# Patient Record
Sex: Female | Born: 1945 | Race: White | Hispanic: No | Smoking: Current every day smoker
Health system: Southern US, Community
[De-identification: ages and names within clinical notes are randomized; demographics above are authoritative.]

## PROBLEM LIST (undated history)

## (undated) DIAGNOSIS — K219 Gastro-esophageal reflux disease without esophagitis: Secondary | ICD-10-CM

## (undated) DIAGNOSIS — E785 Hyperlipidemia, unspecified: Secondary | ICD-10-CM

## (undated) DIAGNOSIS — I739 Peripheral vascular disease, unspecified: Secondary | ICD-10-CM

## (undated) DIAGNOSIS — E039 Hypothyroidism, unspecified: Secondary | ICD-10-CM

## (undated) DIAGNOSIS — N200 Calculus of kidney: Secondary | ICD-10-CM

## (undated) DIAGNOSIS — R7303 Prediabetes: Secondary | ICD-10-CM

## (undated) DIAGNOSIS — I10 Essential (primary) hypertension: Secondary | ICD-10-CM

## (undated) DIAGNOSIS — I499 Cardiac arrhythmia, unspecified: Secondary | ICD-10-CM

## (undated) DIAGNOSIS — D649 Anemia, unspecified: Secondary | ICD-10-CM

## (undated) DIAGNOSIS — M1990 Unspecified osteoarthritis, unspecified site: Secondary | ICD-10-CM

## (undated) DIAGNOSIS — Z87442 Personal history of urinary calculi: Secondary | ICD-10-CM

## (undated) HISTORY — DX: Anemia, unspecified: D64.9

## (undated) HISTORY — PX: TONSILLECTOMY: SUR1361

## (undated) HISTORY — DX: Hypothyroidism, unspecified: E03.9

## (undated) HISTORY — PX: TUBAL LIGATION: SHX77

## (undated) HISTORY — PX: DILATION AND CURETTAGE OF UTERUS: SHX78

## (undated) HISTORY — PX: LITHOTRIPSY: SUR834

---

## 2011-06-10 LAB — HM DEXA SCAN: HM Dexa Scan: NORMAL

## 2015-06-14 LAB — HM MAMMOGRAPHY

## 2020-12-26 DIAGNOSIS — H524 Presbyopia: Secondary | ICD-10-CM | POA: Diagnosis not present

## 2021-04-02 DIAGNOSIS — Z0100 Encounter for examination of eyes and vision without abnormal findings: Secondary | ICD-10-CM | POA: Diagnosis not present

## 2021-05-17 ENCOUNTER — Other Ambulatory Visit: Payer: Self-pay

## 2021-05-17 ENCOUNTER — Ambulatory Visit (INDEPENDENT_AMBULATORY_CARE_PROVIDER_SITE_OTHER): Payer: Medicare HMO

## 2021-05-17 ENCOUNTER — Encounter: Payer: Self-pay | Admitting: Emergency Medicine

## 2021-05-17 ENCOUNTER — Ambulatory Visit
Admission: EM | Admit: 2021-05-17 | Discharge: 2021-05-17 | Disposition: A | Discharge status: Home / Self Care | Payer: Medicare HMO

## 2021-05-17 DIAGNOSIS — G5762 Lesion of plantar nerve, left lower limb: Secondary | ICD-10-CM | POA: Diagnosis not present

## 2021-05-17 DIAGNOSIS — M7732 Calcaneal spur, left foot: Secondary | ICD-10-CM | POA: Diagnosis not present

## 2021-05-17 DIAGNOSIS — S99922A Unspecified injury of left foot, initial encounter: Secondary | ICD-10-CM | POA: Diagnosis not present

## 2021-05-17 DIAGNOSIS — M79672 Pain in left foot: Secondary | ICD-10-CM

## 2021-05-17 DIAGNOSIS — M7989 Other specified soft tissue disorders: Secondary | ICD-10-CM | POA: Diagnosis not present

## 2021-05-17 HISTORY — DX: Calculus of kidney: N20.0

## 2021-05-17 HISTORY — DX: Hyperlipidemia, unspecified: E78.5

## 2021-05-17 HISTORY — DX: Essential (primary) hypertension: I10

## 2021-05-17 MED ORDER — PREDNISONE 20 MG PO TABS
20.0000 mg | ORAL_TABLET | Freq: Every day | ORAL | 0 refills | Status: AC
Start: 2021-05-17 — End: 2021-05-22

## 2021-05-17 MED ORDER — TRAMADOL HCL 50 MG PO TABS: 50.0000 mg | ORAL_TABLET | Freq: Four times a day (QID) | ORAL | 0 refills | Status: DC | PRN

## 2021-05-17 NOTE — ED Provider Notes (Signed)
[facility]    CSN: [id number] Arrival date & time: 05/17/21  0945      History   Chief Complaint Chief Complaint  Patient presents with   Foot Injury    HPI Kaitlin Sharp is a 75 y.o. female presenting with foot injury.  Medical history cyst in the dorsum of left foot, though this seems to still be intact.  States she was walking down some steps yesterday when she thinks that her foot popped and then she felt pain between her great toe and second toe.  She also notes she was wearing shoes all day yesterday, and she typically is at home and is barefoot.  Endorses pain with burning between the first and second toes, worse with ambulating and pressure.  Denies sensation changes.  Denies injury elsewhere, denies falls.    HPI  Past Medical History:  Diagnosis Date   Dyslipidemia    Hypertension    Kidney calculi     There are no problems to display for this patient.   Past Surgical History:  Procedure Laterality Date   LITHOTRIPSY      OB History   No obstetric history on file.      Home Medications    Prior to Admission medications   Medication Sig Start Date End Date Taking? Authorizing Provider  allopurinol (ZYLOPRIM) 100 MG tablet Take 100 mg by mouth daily. 04/16/21  Yes [provider]  levothyroxine (SYNTHROID) 50 MCG tablet Take 50 mcg by mouth daily. 04/18/21  Yes [provider]  lisinopril (ZESTRIL) 20 MG tablet Take 20 mg by mouth daily. 04/16/21  Yes [provider]  pravastatin (PRAVACHOL) 10 MG tablet Take 10 mg by mouth at bedtime. 05/11/21  Yes [provider]  predniSONE (DELTASONE) 20 MG tablet Take 1 tablet (20 mg total) by mouth daily for 5 days. 05/17/21 05/22/21 Yes Rhys Martini, PA-C  spironolactone (ALDACTONE) 25 MG tablet Take 25 mg by mouth daily. 05/11/21  Yes [provider]  traMADol (ULTRAM) 50 MG tablet Take 1 tablet (50 mg total) by mouth every 6 (six) hours as needed.  05/17/21  Yes Rhys Martini, PA-C    Family History History reviewed. No pertinent family history.  Social History Social History   Tobacco Use   Smoking status: Every Day    Types: Cigarettes   Smokeless tobacco: Never     Allergies   Patient has no known allergies.   Review of Systems Review of Systems  Musculoskeletal:        L foot pain  All other systems reviewed and are negative.   Physical Exam Triage Vital Signs ED Triage Vitals  Enc Vitals Group     BP 05/17/21 1035 (!) 161/87     Pulse Rate 05/17/21 1035 68     Resp 05/17/21 1035 14     Temp 05/17/21 1035 (!) 97.5 F (36.4 C)     Temp Source 05/17/21 1035 Oral     SpO2 05/17/21 1035 97 %     Weight --      Height --      Head Circumference --      Peak Flow --      Pain Score 05/17/21 1038 3     Pain Loc --      Pain Edu? --      Excl. in GC? --    No data found.  Updated Vital Signs BP (!) 161/87 (BP Location: Left Arm)  Pulse 68   Temp (!) 97.5 F (36.4 C) (Oral)   Resp 14   SpO2 97%   Visual Acuity Right Eye Distance:   Left Eye Distance:   Bilateral Distance:    Right Eye Near:   Left Eye Near:    Bilateral Near:     Physical Exam Vitals reviewed.  Constitutional:      General: She is not in acute distress.    Appearance: Normal appearance. She is not ill-appearing or diaphoretic.  HENT:     Head: Normocephalic and atraumatic.  Cardiovascular:     Rate and Rhythm: Normal rate and regular rhythm.     Heart sounds: Normal heart sounds.  Pulmonary:     Effort: Pulmonary effort is normal.     Breath sounds: Normal breath sounds.  Musculoskeletal:     Comments: L foot- dorsum with firm well circumscribed cyst, nontender, mobile. Tenderness between great toe and 2nd toe, positive squeeze test. Sensation intact. Rom toes and ankle intact and without pain. No medial or lateral malleolar tenderness. DP 2+, cap refill <2 seconds, sensation intact.  Skin:    General: Skin is warm.   Neurological:     General: No focal deficit present.     Mental Status: She is alert and oriented to person, place, and time.  Psychiatric:        Mood and Affect: Mood normal.        Behavior: Behavior normal.        Thought Content: Thought content normal.        Judgment: Judgment normal.     UC Treatments / Results  Labs (all labs ordered are listed, but only abnormal results are displayed) Labs Reviewed - No data to display  EKG   Radiology DG Foot Complete Left  Result Date: 05/17/2021 CLINICAL DATA:  Stepped and felt a pop, pain and swelling present EXAM: LEFT FOOT - COMPLETE 3+ VIEW COMPARISON:  07/11/2020 FINDINGS: There is no evidence of fracture or dislocation. Joint spaces appear preserved. Small plantar calcaneal spur. Focal soft tissue swelling at the dorsal aspect of the foot overlying the level of the tarsometatarsal joints. IMPRESSION: No acute fracture or dislocation. Focal soft tissue swelling at the dorsal aspect of the foot overlying the tarsometatarsal joints. Electronically Signed   By: Duanne Guess D.O.   On: 05/17/2021 11:19    Procedures Procedures (including critical care time)  Medications Ordered in UC Medications - No data to display  Initial Impression / Assessment and Plan / UC Course  I have reviewed the triage vital signs and the nursing notes.  Pertinent labs & imaging results that were available during my care of the patient were reviewed by me and considered in my medical decision making (see chart for details).     This patient is a very pleasant 75 y.o. year old female presenting with possible Morton's Neuroma. Neurovascularly intact.  Xray L foot- No acute fracture or dislocation. Focal soft tissue swelling at the dorsal aspect of the foot overlying the tarsometatarsal joints.  Prednisone, short course of Tramadol, wear wide shoes. F/u with podiatry.  ED return precautions discussed. Patient verbalizes understanding and  agreement.     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 Morton's neuroma involves a thickening  of the tissue around one of the nerves leading to your toes. This can cause a sharp, burning pain in the ball of your foot. You may have stinging, burning or numbness in the affected toes -Prednisone one pill taken with breakfast or lunch x5 days. I recommend taking this in the morning as it could give you energy.  Avoid NSAIDs like ibuprofen and alleve while taking this medication as they can increase your risk of stomach upset and even GI bleeding when in combination with a steroid. You can continue tylenol (acetaminophen) up to 1000mg  3x daily. -I also sent a short course of Tramadol, which is a prescription strength pain medication. You can take this up to every 6 hours for pain. This can cause drowsiness.  -Wear loose fitting shoes  -Follow-up with PCP or podiatrist if symptoms persist in 4-5 days. Information below.      ED Prescriptions     Medication Sig Dispense Auth. Provider   predniSONE (DELTASONE) 20 MG tablet Take 1 tablet (20 mg total) by mouth daily for 5 days. 5 tablet , PA-C   traMADol (ULTRAM) 50 MG tablet Take 1 tablet (50 mg total) by mouth every 6 (six) hours as needed. 8 tablet Rhys Martini, PA-C      I have reviewed the PDMP during this encounter.   Rhys Martini, PA-C 05/17/21 1212

## 2021-05-17 NOTE — ED Triage Notes (Signed)
Felt a pop in her foot yesterday, has hurt since. Happened when she stepped down. Says it's popped before but she never thought much of it. Has a cyst on that foot, appears to still be intact. Foot swollen, no obvious injury or bruising.

## 2021-05-17 NOTE — Discharge Instructions (Addendum)
-  You don't have any fracture in your foot -I think you have a Morton's Neuroma. Morton's neuroma involves a thickening of the tissue around one of the nerves leading to your toes. This can cause a sharp, burning pain in the ball of your foot. You may have stinging, burning or numbness in the affected toes -Prednisone one pill taken with breakfast or lunch x5 days. I recommend taking this in the morning as it could give you energy.  Avoid NSAIDs like ibuprofen and alleve while taking this medication as they can increase your risk of stomach upset and even GI bleeding when in combination with a steroid. You can continue tylenol (acetaminophen) up to 1000mg  3x daily. -I also sent a short course of Tramadol, which is a prescription strength pain medication. You can take this up to every 6 hours for pain. This can cause drowsiness.  -Wear loose fitting shoes  -Follow-up with PCP or podiatrist if symptoms persist in 4-5 days. Information below.

## 2021-05-30 DIAGNOSIS — J449 Chronic obstructive pulmonary disease, unspecified: Secondary | ICD-10-CM | POA: Diagnosis not present

## 2021-05-30 DIAGNOSIS — R0600 Dyspnea, unspecified: Secondary | ICD-10-CM | POA: Diagnosis not present

## 2021-05-30 DIAGNOSIS — R799 Abnormal finding of blood chemistry, unspecified: Secondary | ICD-10-CM | POA: Diagnosis not present

## 2021-05-30 DIAGNOSIS — M150 Primary generalized (osteo)arthritis: Secondary | ICD-10-CM | POA: Diagnosis not present

## 2021-05-30 DIAGNOSIS — R051 Acute cough: Secondary | ICD-10-CM | POA: Diagnosis not present

## 2021-05-30 DIAGNOSIS — E119 Type 2 diabetes mellitus without complications: Secondary | ICD-10-CM | POA: Diagnosis not present

## 2021-05-30 DIAGNOSIS — Z0001 Encounter for general adult medical examination with abnormal findings: Secondary | ICD-10-CM | POA: Diagnosis not present

## 2021-05-30 DIAGNOSIS — Z79899 Other long term (current) drug therapy: Secondary | ICD-10-CM | POA: Diagnosis not present

## 2021-05-30 DIAGNOSIS — E039 Hypothyroidism, unspecified: Secondary | ICD-10-CM | POA: Diagnosis not present

## 2021-05-30 DIAGNOSIS — R351 Nocturia: Secondary | ICD-10-CM | POA: Diagnosis not present

## 2021-05-30 DIAGNOSIS — R079 Chest pain, unspecified: Secondary | ICD-10-CM | POA: Diagnosis not present

## 2021-05-30 DIAGNOSIS — I10 Essential (primary) hypertension: Secondary | ICD-10-CM | POA: Diagnosis not present

## 2021-05-30 DIAGNOSIS — E7800 Pure hypercholesterolemia, unspecified: Secondary | ICD-10-CM | POA: Diagnosis not present

## 2021-05-30 DIAGNOSIS — M1009 Idiopathic gout, multiple sites: Secondary | ICD-10-CM | POA: Diagnosis not present

## 2021-05-31 DIAGNOSIS — Z1211 Encounter for screening for malignant neoplasm of colon: Secondary | ICD-10-CM | POA: Diagnosis not present

## 2021-06-26 DIAGNOSIS — K921 Melena: Secondary | ICD-10-CM | POA: Diagnosis not present

## 2021-07-02 DIAGNOSIS — K648 Other hemorrhoids: Secondary | ICD-10-CM | POA: Diagnosis not present

## 2021-07-02 DIAGNOSIS — K5730 Diverticulosis of large intestine without perforation or abscess without bleeding: Secondary | ICD-10-CM | POA: Diagnosis not present

## 2021-07-02 DIAGNOSIS — K921 Melena: Secondary | ICD-10-CM | POA: Diagnosis not present

## 2021-07-02 LAB — HM COLONOSCOPY

## 2021-08-30 DIAGNOSIS — E039 Hypothyroidism, unspecified: Secondary | ICD-10-CM | POA: Diagnosis not present

## 2021-08-30 DIAGNOSIS — R1013 Epigastric pain: Secondary | ICD-10-CM | POA: Diagnosis not present

## 2021-08-30 DIAGNOSIS — E119 Type 2 diabetes mellitus without complications: Secondary | ICD-10-CM | POA: Diagnosis not present

## 2021-08-30 DIAGNOSIS — Z79899 Other long term (current) drug therapy: Secondary | ICD-10-CM | POA: Diagnosis not present

## 2021-08-30 DIAGNOSIS — R0600 Dyspnea, unspecified: Secondary | ICD-10-CM | POA: Diagnosis not present

## 2021-08-30 DIAGNOSIS — E7800 Pure hypercholesterolemia, unspecified: Secondary | ICD-10-CM | POA: Diagnosis not present

## 2021-08-30 DIAGNOSIS — R42 Dizziness and giddiness: Secondary | ICD-10-CM | POA: Diagnosis not present

## 2021-08-30 DIAGNOSIS — I10 Essential (primary) hypertension: Secondary | ICD-10-CM | POA: Diagnosis not present

## 2021-08-30 LAB — COMPREHENSIVE METABOLIC PANEL: eGFR: 42

## 2021-08-30 LAB — BASIC METABOLIC PANEL
Creatinine: 1.3 — AB (ref 0.5–1.1)
EGFR (Non-African Amer.): 42
Glucose: 110

## 2021-08-30 LAB — LIPID PANEL: Cholesterol: 49 (ref 0–200)

## 2022-04-22 ENCOUNTER — Ambulatory Visit (INDEPENDENT_AMBULATORY_CARE_PROVIDER_SITE_OTHER): Payer: Medicare HMO | Admitting: Nurse Practitioner

## 2022-04-22 ENCOUNTER — Encounter: Payer: Self-pay | Admitting: Nurse Practitioner

## 2022-04-22 VITALS — BP 121/77 | HR 61 | Temp 97.8°F | Ht 61.02 in | Wt 150.0 lb

## 2022-04-22 DIAGNOSIS — Z7689 Persons encountering health services in other specified circumstances: Secondary | ICD-10-CM | POA: Diagnosis not present

## 2022-04-22 DIAGNOSIS — M064 Inflammatory polyarthropathy: Secondary | ICD-10-CM | POA: Diagnosis not present

## 2022-04-22 DIAGNOSIS — M1A09X0 Idiopathic chronic gout, multiple sites, without tophus (tophi): Secondary | ICD-10-CM | POA: Diagnosis not present

## 2022-04-22 DIAGNOSIS — Z6828 Body mass index (BMI) 28.0-28.9, adult: Secondary | ICD-10-CM

## 2022-04-22 DIAGNOSIS — E039 Hypothyroidism, unspecified: Secondary | ICD-10-CM | POA: Diagnosis not present

## 2022-04-22 MED ORDER — LEVOTHYROXINE SODIUM 50 MCG PO TABS: 50.0000 ug | ORAL_TABLET | Freq: Every day | ORAL | 1 refills | Status: DC

## 2022-04-22 MED ORDER — ALLOPURINOL 100 MG PO TABS: 100.0000 mg | ORAL_TABLET | Freq: Every day | ORAL | 3 refills | Status: DC

## 2022-04-22 NOTE — Progress Notes (Signed)
New Patient Office Visit  Subjective    Patient ID: Kaitlin Sharp, female    DOB: May 13, 1946  Age: 76 y.o. MRN: [id number]  CC:  Chief Complaint  Patient presents with   New Patient (Initial Visit)    HPI Kaitlin Sharp presents to establish care -The patient is coming from a different provider who is getting ready to retire. Will need to get records to review.  -will be due to have routine, fasting labs done along with wellness visit in august.  -she does need new prescriptions for allopurinol and levothyroxine.  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 Takes tylenol   Outpatient Encounter Medications as of 04/22/2022  Medication Sig   lisinopril (ZESTRIL) 20 MG tablet Take 20 mg by mouth daily.   pravastatin (PRAVACHOL) 10 MG tablet Take 10 mg by mouth at bedtime.   spironolactone (ALDACTONE) 25 MG tablet Take 25 mg by mouth daily.   [DISCONTINUED] allopurinol (ZYLOPRIM) 100 MG tablet Take 100 mg by mouth daily.   [DISCONTINUED] levothyroxine (SYNTHROID) 50 MCG tablet Take 50 mcg by mouth daily.   allopurinol (ZYLOPRIM) 100 MG tablet Take 1 tablet (100 mg total) by mouth daily.   levothyroxine (SYNTHROID) 50 MCG tablet Take 1 tablet (50 mcg total) by mouth daily.   traMADol (ULTRAM) 50 MG tablet Take 1 tablet (50 mg total) by mouth every 6 (six) hours as needed. (Patient not taking: Reported on 04/22/2022)   No facility-administered encounter medications on file as of 04/22/2022.    Past Medical History:  Diagnosis Date   Dyslipidemia    Hypertension    Kidney calculi     Past Surgical History:  Procedure Laterality Date   LITHOTRIPSY      Family History  Problem Relation Age of Onset   Brain cancer Father    Heart attack Brother     Social History   Socioeconomic History   Marital status: Single    Spouse name: Not on file   Number of children: Not on file   Years of education: Not on file   Highest education level: Not on file  Occupational  History   Not on file  Tobacco Use   Smoking status: Every Day    Types: Cigarettes   Smokeless tobacco: Never  Vaping Use   Vaping Use: Not on file  Substance and Sexual Activity   Alcohol use: Yes   Drug use: Never   Sexual activity: Not Currently  Other Topics Concern   Not on file  Social History Narrative   Not on file   Social Determinants of Health   Financial Resource Strain: Not on file  Food Insecurity: Not on file  Transportation Needs: Not on file  Physical Activity: Not on file  Stress: Not on file  Social Connections: Not on file  Intimate Partner Violence: Not on file    Review of Systems  Constitutional:  Positive for malaise/fatigue. Negative for chills and fever.  HENT:  Negative for congestion, sinus pain and sore throat.   Eyes: Negative.   Respiratory:  Negative for cough, shortness of breath and wheezing.   Cardiovascular:  Negative for chest pain, palpitations and leg swelling.  Gastrointestinal:  Negative for constipation, diarrhea, nausea and vomiting.  Genitourinary: Negative.   Musculoskeletal:  Positive for joint pain and myalgias.  Skin: Negative.   Neurological:  Negative for dizziness and headaches.  Endo/Heme/Allergies:  Does not bruise/bleed easily.       History of hypothyroid   Psychiatric/Behavioral:  Negative for depression. The patient is not nervous/anxious.         Objective    Today's Vitals   04/22/22 1522  BP: 121/77  Pulse: 61  Temp: 97.8 F (36.6 C)  SpO2: 98%  Weight: 150 lb (68 kg)  Height: 5' 1.02" (1.55 m)   Body mass index is 28.32 kg/m.   Physical Exam     Assessment & Plan:  1. Acquired hypothyroidism Continue levothyroxine 50 mcg daily. Check thyroid panel at next visit and adjust thyroid medication as indicated.  - levothyroxine (SYNTHROID) 50 MCG tablet; Take 1 tablet (50 mcg total) by mouth daily.  Dispense: 90 tablet; Refill: 1  2. Idiopathic chronic gout of multiple sites without  tophus Continue allopurinol 100 mg daily to prevent gout flares. Check uric acid level at next visit.  - allopurinol (ZYLOPRIM) 100 MG tablet; Take 1 tablet (100 mg total) by mouth daily.  Dispense: 90 tablet; Refill: 3  3. Inflammatory polyarthropathy (HCC) Check connective tissue panel at next visit and refer to rheumatology as indicated.   4. BMI 28.0-28.9,adult Discussed lowering calorie intake to 1500 calories per day and incorporating exercise into daily routine to help lose weight.   5. Encounter to establish care Appointment today to establish new primary care provider. Get records from previous provider to review and update patient chart.     Problem List Items Addressed This Visit       Endocrine   Acquired hypothyroidism - Primary   Relevant Medications   levothyroxine (SYNTHROID) 50 MCG tablet     Musculoskeletal and Integument   Inflammatory polyarthropathy (HCC)   Relevant Medications   allopurinol (ZYLOPRIM) 100 MG tablet     Other   Idiopathic chronic gout of multiple sites without tophus   Relevant Medications   allopurinol (ZYLOPRIM) 100 MG tablet   BMI 28.0-28.9,adult   Other Visit Diagnoses     Encounter to establish care           Return in about 2 months (around 06/22/2022) for medicare wellness, FBW at time of visit - Plesse get records from Dr. Levora Angel. pt. has letter .   Carlean Jews, NP

## 2022-04-27 DIAGNOSIS — M1A09X0 Idiopathic chronic gout, multiple sites, without tophus (tophi): Secondary | ICD-10-CM | POA: Insufficient documentation

## 2022-04-27 DIAGNOSIS — Z6828 Body mass index (BMI) 28.0-28.9, adult: Secondary | ICD-10-CM | POA: Insufficient documentation

## 2022-04-27 DIAGNOSIS — M064 Inflammatory polyarthropathy: Secondary | ICD-10-CM | POA: Insufficient documentation

## 2022-04-27 DIAGNOSIS — E039 Hypothyroidism, unspecified: Secondary | ICD-10-CM | POA: Insufficient documentation

## 2022-06-20 ENCOUNTER — Other Ambulatory Visit: Payer: Self-pay | Admitting: Nurse Practitioner

## 2022-06-23 ENCOUNTER — Ambulatory Visit (INDEPENDENT_AMBULATORY_CARE_PROVIDER_SITE_OTHER): Payer: Medicare HMO | Admitting: Nurse Practitioner

## 2022-06-23 ENCOUNTER — Encounter: Payer: Self-pay | Admitting: Nurse Practitioner

## 2022-06-23 VITALS — BP 116/69 | HR 60 | Ht 61.02 in | Wt 154.0 lb

## 2022-06-23 DIAGNOSIS — M1A00X0 Idiopathic chronic gout, unspecified site, without tophus (tophi): Secondary | ICD-10-CM | POA: Diagnosis not present

## 2022-06-23 DIAGNOSIS — E039 Hypothyroidism, unspecified: Secondary | ICD-10-CM

## 2022-06-23 DIAGNOSIS — Z0000 Encounter for general adult medical examination without abnormal findings: Secondary | ICD-10-CM | POA: Diagnosis not present

## 2022-06-23 DIAGNOSIS — E782 Mixed hyperlipidemia: Secondary | ICD-10-CM | POA: Diagnosis not present

## 2022-06-23 DIAGNOSIS — I10 Essential (primary) hypertension: Secondary | ICD-10-CM | POA: Diagnosis not present

## 2022-06-23 MED ORDER — LISINOPRIL 20 MG PO TABS: 20.0000 mg | ORAL_TABLET | Freq: Every day | ORAL | 3 refills | Status: DC

## 2022-06-23 MED ORDER — SPIRONOLACTONE 25 MG PO TABS: 25.0000 mg | ORAL_TABLET | Freq: Every day | ORAL | 3 refills | Status: DC

## 2022-06-23 MED ORDER — PRAVASTATIN SODIUM 10 MG PO TABS: 10.0000 mg | ORAL_TABLET | Freq: Every day | ORAL | 3 refills | Status: DC

## 2022-06-23 NOTE — Progress Notes (Signed)
Subjective:   Kaitlin Sharp is a 76 y.o. female who presents for Medicare Annual (Subsequent) preventive examination. -would like all of her routine medications changed over to this office.  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 Gradually worsening. Would like ot do some exercises rather than take new medication for this if possible.  -she is a smoker. Smokes a little more than 1/2 pack of cigarettes per day.  -denies chest pain, chest pressure, or shortness of breath which is out of the ordinary for her.   Review of Systems    Review of Systems  Constitutional:  Negative for chills, fever and malaise/fatigue.  HENT:  Negative for congestion, sinus pain and sore throat.   Eyes: Negative.   Respiratory:  Negative for cough, shortness of breath and wheezing.   Cardiovascular:  Negative for chest pain, palpitations and leg swelling.  Gastrointestinal:  Negative for constipation, diarrhea, nausea and vomiting.  Genitourinary: Negative.   Musculoskeletal:  Negative for myalgias.  Skin: Negative.   Neurological:  Negative for dizziness and headaches.  Endo/Heme/Allergies:  Does not bruise/bleed easily.  Psychiatric/Behavioral:  Negative for depression. The patient is not nervous/anxious.           Objective:    Today's Vitals   06/23/22 0943  BP: 116/69  Pulse: 60  SpO2: 97%  Weight: 154 lb (69.9 kg)  Height: 5' 1.02" (1.55 m)   Body mass index is 29.08 kg/m.   Current Medications (verified) Outpatient Encounter Medications as of 06/23/2022  Medication Sig   allopurinol (ZYLOPRIM) 100 MG tablet Take 1 tablet (100 mg total) by mouth daily.   levothyroxine (SYNTHROID) 50 MCG tablet Take 1 tablet (50 mcg total) by mouth daily.   [DISCONTINUED] lisinopril (ZESTRIL) 20 MG tablet Take 20 mg by mouth daily.   [DISCONTINUED] pravastatin (PRAVACHOL) 10 MG tablet Take 10 mg by mouth at bedtime.   [DISCONTINUED] spironolactone (ALDACTONE) 25 MG tablet  Take 25 mg by mouth daily.   lisinopril (ZESTRIL) 20 MG tablet Take 1 tablet (20 mg total) by mouth daily.   pravastatin (PRAVACHOL) 10 MG tablet Take 1 tablet (10 mg total) by mouth at bedtime.   spironolactone (ALDACTONE) 25 MG tablet Take 1 tablet (25 mg total) by mouth daily.   traMADol (ULTRAM) 50 MG tablet Take 1 tablet (50 mg total) by mouth every 6 (six) hours as needed. (Patient not taking: Reported on 04/22/2022)   No facility-administered encounter medications on file as of 06/23/2022.    Allergies (verified) Patient has no known allergies.   History: Past Medical History:  Diagnosis Date   Dyslipidemia    Hypertension    Kidney calculi    Past Surgical History:  Procedure Laterality Date   LITHOTRIPSY     Family History  Problem Relation Age of Onset   Brain cancer Father    Heart attack Brother    Social History   Socioeconomic History   Marital status: Single    Spouse name: Not on file   Number of children: Not on file   Years of education: Not on file   Highest education level: Not on file  Occupational History   Not on file  Tobacco Use   Smoking status: Every Day    Types: Cigarettes   Smokeless tobacco: Never  Vaping Use   Vaping Use: Not on file  Substance and Sexual Activity   Alcohol use: Yes   Drug use: Never   Sexual activity: Not Currently  Other Topics Concern   Not on file  Social History Narrative   Not on file   Social Determinants of Health   Financial Resource Strain: Not on file  Food Insecurity: Not on file  Transportation Needs: Not on file  Physical Activity: Not on file  Stress: Not on file  Social Connections: Not on file   Physical Exam Vitals and nursing note reviewed.  Constitutional:      Appearance: Normal appearance. She is well-developed.  HENT:     Head: Normocephalic and atraumatic.     Right Ear: Tympanic membrane, ear canal and external ear normal.     Left Ear: Tympanic membrane, ear canal and external  ear normal.     Nose: Nose normal.     Mouth/Throat:     Mouth: Mucous membranes are moist.     Pharynx: Oropharynx is clear.  Eyes:     Extraocular Movements: Extraocular movements intact.     Conjunctiva/sclera: Conjunctivae normal.     Pupils: Pupils are equal, round, and reactive to light.  Cardiovascular:     Rate and Rhythm: Normal rate and regular rhythm.     Pulses: Normal pulses.     Heart sounds: Normal heart sounds.  Pulmonary:     Effort: Pulmonary effort is normal.     Breath sounds: Wheezing present.     Comments: Soft, low-pitched wheezes auscultated during today's visit  Abdominal:     General: Bowel sounds are normal. There is no distension.     Palpations: Abdomen is soft. There is no mass.     Tenderness: There is no abdominal tenderness. There is no right CVA tenderness, left CVA tenderness, guarding or rebound.     Hernia: No hernia is present.  Musculoskeletal:        General: Normal range of motion.     Cervical back: Normal range of motion and neck supple.  Lymphadenopathy:     Cervical: No cervical adenopathy.  Skin:    General: Skin is warm and dry.     Capillary Refill: Capillary refill takes less than 2 seconds.  Neurological:     General: No focal deficit present.     Mental Status: She is alert and oriented to person, place, and time.  Psychiatric:        Mood and Affect: Mood normal.        Behavior: Behavior normal.        Thought Content: Thought content normal.        Judgment: Judgment normal.      Tobacco Counseling Patient is a current and every day smoker, smoking about 1/2 pack of cigarettes per day. She is not ready to quit at this time.   Diabetic?no   Activities of Daily Living    04/22/2022    3:29 PM  In your present state of health, do you have any difficulty performing the following activities:  Hearing? 0  Vision? 0  Difficulty concentrating or making decisions? 0  Walking or climbing stairs? 1  Dressing or bathing?  0  Doing errands, shopping? 0    Patient Care Team: Carlean Jews, NP as PCP - General (Family Medicine)  Indicate any recent Medical Services you may have received from other than [facility] providers in the past year (date may be approximate).     Assessment:  1. Encounter for Medicare annual wellness exam Annual medicare wellness visit   2. Essential hypertension Stable. Continue lisinopril and aldactone as prescribed. New prescriptions  sent to her pharmacy today  - lisinopril (ZESTRIL) 20 MG tablet; Take 1 tablet (20 mg total) by mouth daily.  Dispense: 90 tablet; Refill: 3 - spironolactone (ALDACTONE) 25 MG tablet; Take 1 tablet (25 mg total) by mouth daily.  Dispense: 90 tablet; Refill: 3  3. Mixed hyperlipidemia Check fasting lipids today. Adjust dose pravastatin as indicated  - Lipid panel - Comprehensive metabolic panel - CBC - pravastatin (PRAVACHOL) 10 MG tablet; Take 1 tablet (10 mg total) by mouth at bedtime.  Dispense: 90 tablet; Refill: 3  4. Acquired hypothyroidism Check thyroid panel and adjust levothyroxine as indicated  - TSH + free T4 - Hemoglobin A1c  5. Chronic idiopathic gout Continue allopurinol daily to prevent gout flare  6. Healthcare maintenance Routine, fasting labs drawn during today's visit.  - Hemoglobin A1c - Lipid panel - Comprehensive metabolic panel - CBC   Hearing/Vision screen No results found.   Depression Screen    06/23/2022    9:45 AM 04/22/2022    3:29 PM  PHQ 2/9 Scores  PHQ - 2 Score 0 0  PHQ- 9 Score 4 2    Fall Risk    06/23/2022    9:49 AM  Fall Risk   Falls in the past year? 0  Number falls in past yr: 0  Injury with Fall? 0  Risk for fall due to : No Fall Risks  Follow up Falls evaluation completed    FALL RISK PREVENTION PERTAINING TO THE HOME:  Any stairs in or around the home? Yes  If so, are there any without handrails? Yes  Home free of loose throw rugs in walkways, pet beds, electrical cords, etc?  Yes  Adequate lighting in your home to reduce risk of falls? Yes   ASSISTIVE DEVICES UTILIZED TO PREVENT FALLS:  Life alert? No  Use of a cane, walker or w/c? Yes  Grab bars in the bathroom? Yes  Shower chair or bench in shower? Yes  Elevated toilet seat or a handicapped toilet? No   TIMED UP AND GO:  Was the test performed? Yes .  Length of time to ambulate 10 feet: 10 sec.   Gait steady and fast without use of assistive device  Cognitive Function:        06/23/2022    9:49 AM  6CIT Screen  What Year? 0 points  What month? 0 points  Count back from 20 0 points  Months in reverse 0 points  Repeat phrase 0 points    Immunizations Immunization History  Administered Date(s) Administered   Influenza, Quadrivalent, Recombinant, Inj, Pf 07/20/2021   PFIZER(Purple Top)SARS-COV-2 Vaccination 01/08/2020, 01/30/2020   PNEUMOCOCCAL CONJUGATE-20 07/20/2021   Zoster Recombinat (Shingrix) 05/22/2018, 08/16/2018    TDAP status: Due, Education has been provided regarding the importance of this vaccine. Advised may receive this vaccine at local pharmacy or Health Dept. Aware to provide a copy of the vaccination record if obtained from local pharmacy or Health Dept. Verbalized acceptance and understanding.  Flu Vaccine status: Due, Education has been provided regarding the importance of this vaccine. Advised may receive this vaccine at local pharmacy or Health Dept. Aware to provide a copy of the vaccination record if obtained from local pharmacy or Health Dept. Verbalized acceptance and understanding.  Pneumococcal vaccine status: Due, Education has been provided regarding the importance of this vaccine. Advised may receive this vaccine at local pharmacy or Health Dept. Aware to provide a copy of the vaccination record if obtained from local pharmacy  or Health Dept. Verbalized acceptance and understanding.  Covid-19 vaccine status: Information provided on how to obtain vaccines.    Qualifies for Shingles Vaccine? Yes   Zostavax completed No   Shingrix Completed?: No.    Education has been provided regarding the importance of this vaccine. Patient has been advised to call insurance company to determine out of pocket expense if they have not yet received this vaccine. Advised may also receive vaccine at local pharmacy or Health Dept. Verbalized acceptance and understanding.  Screening Tests Health Maintenance  Topic Date Due   Hepatitis C Screening  Never done   COLONOSCOPY (Pts 45-29yrs Insurance coverage will need to be confirmed)  Never done   DEXA SCAN  Never done   INFLUENZA VACCINE  05/27/2022   COVID-19 Vaccine (3 - Pfizer series) 07/09/2022 (Originally 03/26/2020)   TETANUS/TDAP  06/24/2023 (Originally 05/20/1965)   Pneumonia Vaccine 61+ Years old  Completed   Zoster Vaccines- Shingrix  Completed   HPV VACCINES  Aged Out    Health Maintenance  Health Maintenance Due  Topic Date Due   Hepatitis C Screening  Never done   COLONOSCOPY (Pts 45-18yrs Insurance coverage will need to be confirmed)  Never done   DEXA SCAN  Never done   INFLUENZA VACCINE  05/27/2022    Colorectal cancer screening: Type of screening: Colonoscopy. Completed unknown. Repeat every unknown years  Mammogram -  patient does not remember   Lung Cancer Screening: (Low Dose CT Chest recommended if Age 70-80 years, 30 pack-year currently smoking OR have quit w/in 15years.) does not qualify.   Lung Cancer Screening Referral: n/a  Additional Screening:  Hepatitis C Screening: does not qualify; Completed   Vision Screening: Recommended annual ophthalmology exams for early detection of glaucoma and other disorders of the eye. Is the patient up to date with their annual eye exam?  Yes  Who is the provider or what is the name of the office in which the patient attends annual eye exams? [facility] If pt is not established with a provider, would they like to be referred to a provider  to establish care? No .   Dental Screening: Recommended annual dental exams for proper oral hygiene  Community Resource Referral / Chronic Care Management: CRR required this visit?  No   CCM required this visit?  No      Plan:     I have personally reviewed and noted the following in the patient's chart:   Medical and social history Use of alcohol, tobacco or illicit drugs  Current medications and supplements including opioid prescriptions. Patient is not currently taking opioid prescriptions. Functional ability and status Nutritional status Physical activity Advanced directives List of other physicians Hospitalizations, surgeries, and ER visits in previous 12 months Vitals Screenings to include cognitive, depression, and falls Referrals and appointments  In addition, I have reviewed and discussed with patient certain preventive protocols, quality metrics, and best practice recommendations. A written personalized care plan for preventive services as well as general preventive health recommendations were provided to patient.     Vincent Gros, FNP-c 06/23/2022

## 2022-06-24 LAB — COMPREHENSIVE METABOLIC PANEL
ALT: 16 IU/L (ref 0–32)
AST: 19 IU/L (ref 0–40)
Albumin/Globulin Ratio: 2 (ref 1.2–2.2)
Albumin: 4.3 g/dL (ref 3.8–4.8)
Alkaline Phosphatase: 90 IU/L (ref 44–121)
BUN/Creatinine Ratio: 17 (ref 12–28)
BUN: 22 mg/dL (ref 8–27)
Bilirubin Total: 0.4 mg/dL (ref 0.0–1.2)
CO2: 20 mmol/L (ref 20–29)
Calcium: 11 mg/dL — ABNORMAL HIGH (ref 8.7–10.3)
Chloride: 104 mmol/L (ref 96–106)
Creatinine, Ser: 1.31 mg/dL — ABNORMAL HIGH (ref 0.57–1.00)
Globulin, Total: 2.1 g/dL (ref 1.5–4.5)
Glucose: 106 mg/dL — ABNORMAL HIGH (ref 70–99)
Potassium: 5.2 mmol/L (ref 3.5–5.2)
Sodium: 139 mmol/L (ref 134–144)
Total Protein: 6.4 g/dL (ref 6.0–8.5)
eGFR: 42 mL/min/{1.73_m2} — ABNORMAL LOW (ref >59)

## 2022-06-24 LAB — CBC
Hematocrit: 35.4 % (ref 34.0–46.6)
Hemoglobin: 12.3 g/dL (ref 11.1–15.9)
MCH: 33.9 pg — ABNORMAL HIGH (ref 26.6–33.0)
MCHC: 34.7 g/dL (ref 31.5–35.7)
MCV: 98 fL — ABNORMAL HIGH (ref 79–97)
Platelets: 160 10*3/uL (ref 150–450)
RBC: 3.63 x10E6/uL — ABNORMAL LOW (ref 3.77–5.28)
RDW: 12 % (ref 11.7–15.4)
WBC: 8.3 10*3/uL (ref 3.4–10.8)

## 2022-06-24 LAB — TSH+FREE T4
Free T4: 1.23 ng/dL (ref 0.82–1.77)
TSH: 5.64 u[IU]/mL — ABNORMAL HIGH (ref 0.450–4.500)

## 2022-06-24 LAB — LIPID PANEL
Chol/HDL Ratio: 3.9 ratio (ref 0.0–4.4)
Cholesterol, Total: 172 mg/dL (ref 100–199)
HDL: 44 mg/dL (ref >39)
LDL Chol Calc (NIH): 97 mg/dL (ref 0–99)
Triglycerides: 179 mg/dL — ABNORMAL HIGH (ref 0–149)
VLDL Cholesterol Cal: 31 mg/dL (ref 5–40)

## 2022-06-24 LAB — HEMOGLOBIN A1C
Est. average glucose Bld gHb Est-mCnc: 128 mg/dL
Hgb A1c MFr Bld: 6.1 % — ABNORMAL HIGH (ref 4.8–5.6)

## 2022-06-28 IMAGING — DX DG FOOT COMPLETE 3+V*L*
3 series · 3 of 3 positions shown · non-contrast
Comparison: 07/11/2020

CLINICAL DATA: Stepped and felt a pop, pain and swelling present

EXAM:
LEFT FOOT - COMPLETE 3+ VIEW

[foot supine dp]
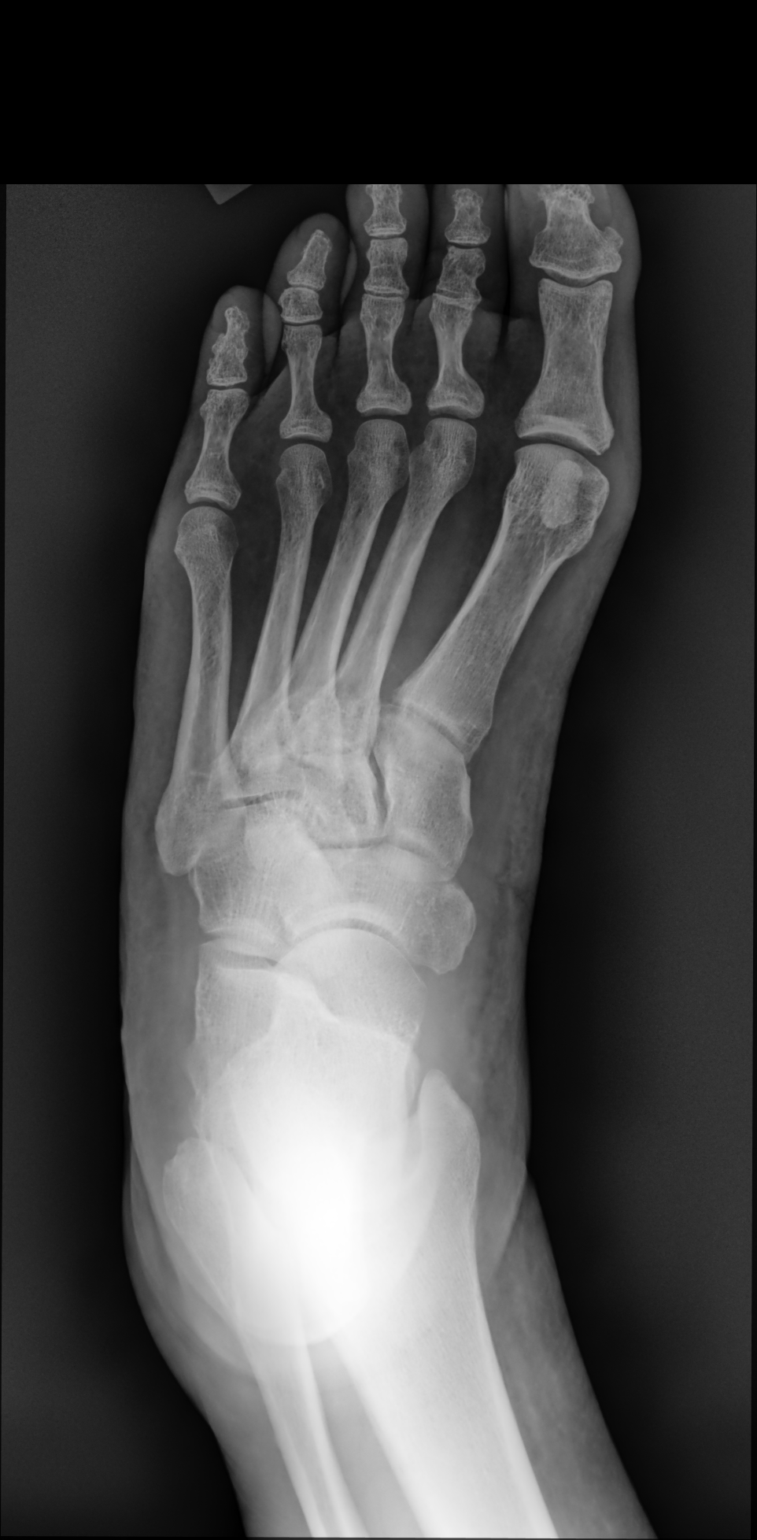

[foot medial oblique]
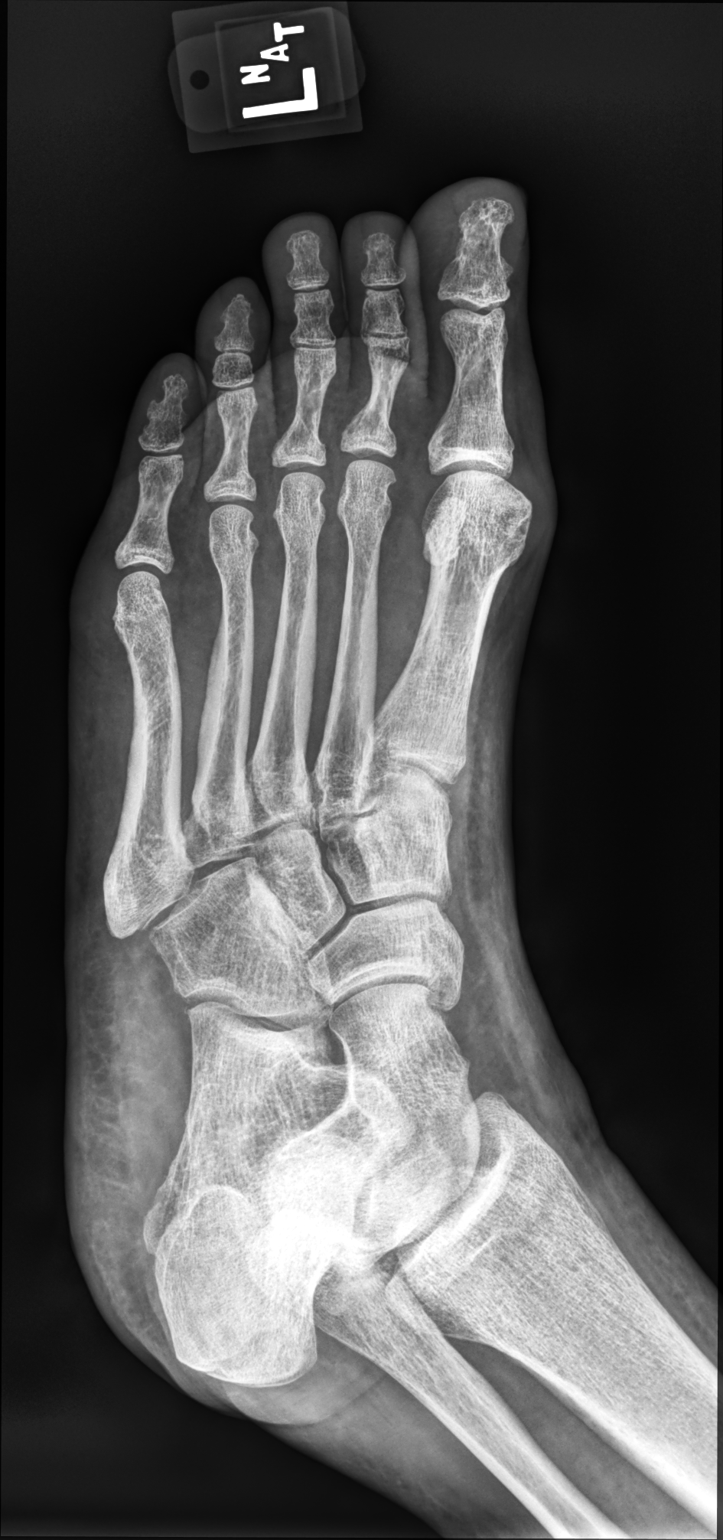

[foot supine lat]
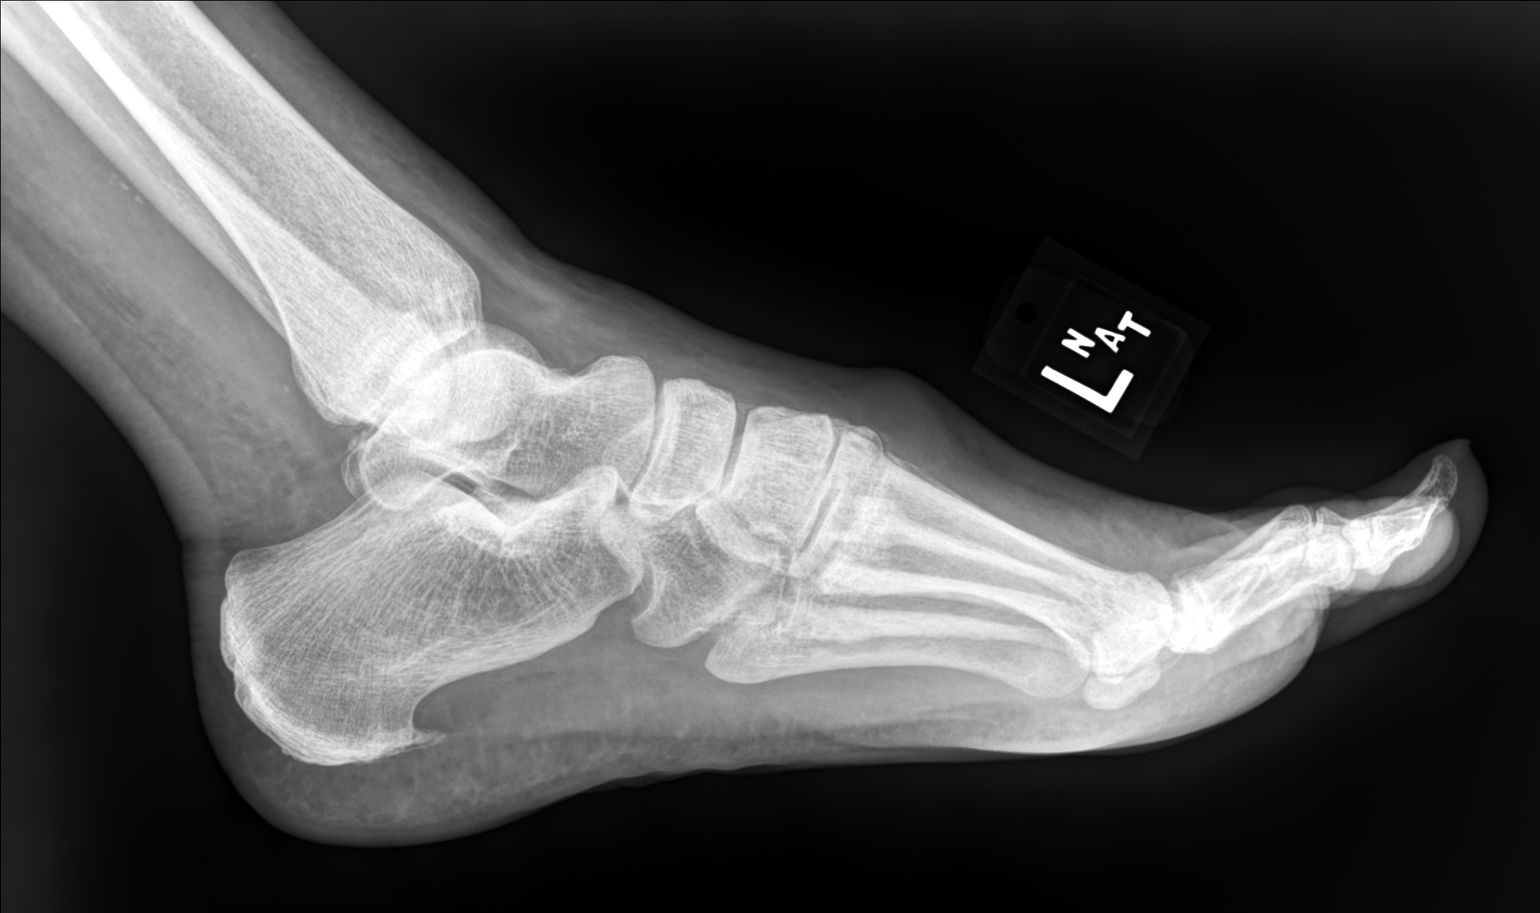

[3 of 3 positions shown; findings below may reference images not displayed]

FINDINGS: There is no evidence of fracture or dislocation. Joint spaces appear
preserved. Small plantar calcaneal spur. Focal soft tissue swelling
at the dorsal aspect of the foot overlying the level of the
tarsometatarsal joints.
IMPRESSION: No acute fracture or dislocation. Focal soft tissue swelling at the
dorsal aspect of the foot overlying the tarsometatarsal joints.

## 2022-07-02 ENCOUNTER — Encounter: Payer: Self-pay | Admitting: Nurse Practitioner

## 2022-07-02 ENCOUNTER — Other Ambulatory Visit: Payer: Self-pay | Admitting: Nurse Practitioner

## 2022-07-02 DIAGNOSIS — E782 Mixed hyperlipidemia: Secondary | ICD-10-CM

## 2022-07-02 MED ORDER — PRAVASTATIN SODIUM 20 MG PO TABS: 20.0000 mg | ORAL_TABLET | Freq: Every day | ORAL | 1 refills | Status: DC

## 2022-07-02 NOTE — Progress Notes (Signed)
Mychart message sent to patient -  Your cholesterol looked good in general.  Question for you. Have you ever seen a kidney doctor? I noticed that your kidney functions were up a little. Has that ever been a problem in the past?  Thyroid is stable. No changes to the levothyroxine.

## 2023-01-10 ENCOUNTER — Other Ambulatory Visit: Payer: Self-pay | Admitting: Nurse Practitioner

## 2023-01-10 DIAGNOSIS — E039 Hypothyroidism, unspecified: Secondary | ICD-10-CM

## 2023-02-02 ENCOUNTER — Other Ambulatory Visit: Payer: Self-pay | Admitting: Nurse Practitioner

## 2023-02-02 DIAGNOSIS — E039 Hypothyroidism, unspecified: Secondary | ICD-10-CM

## 2023-02-11 DIAGNOSIS — I499 Cardiac arrhythmia, unspecified: Secondary | ICD-10-CM | POA: Diagnosis not present

## 2023-02-11 DIAGNOSIS — I10 Essential (primary) hypertension: Secondary | ICD-10-CM | POA: Diagnosis not present

## 2023-02-11 DIAGNOSIS — Z803 Family history of malignant neoplasm of breast: Secondary | ICD-10-CM | POA: Diagnosis not present

## 2023-02-11 DIAGNOSIS — E785 Hyperlipidemia, unspecified: Secondary | ICD-10-CM | POA: Diagnosis not present

## 2023-02-11 DIAGNOSIS — M1990 Unspecified osteoarthritis, unspecified site: Secondary | ICD-10-CM | POA: Diagnosis not present

## 2023-02-11 DIAGNOSIS — M109 Gout, unspecified: Secondary | ICD-10-CM | POA: Diagnosis not present

## 2023-02-11 DIAGNOSIS — Z8249 Family history of ischemic heart disease and other diseases of the circulatory system: Secondary | ICD-10-CM | POA: Diagnosis not present

## 2023-02-11 DIAGNOSIS — R32 Unspecified urinary incontinence: Secondary | ICD-10-CM | POA: Diagnosis not present

## 2023-02-11 DIAGNOSIS — Z008 Encounter for other general examination: Secondary | ICD-10-CM | POA: Diagnosis not present

## 2023-02-11 DIAGNOSIS — M329 Systemic lupus erythematosus, unspecified: Secondary | ICD-10-CM | POA: Diagnosis not present

## 2023-02-11 DIAGNOSIS — E039 Hypothyroidism, unspecified: Secondary | ICD-10-CM | POA: Diagnosis not present

## 2023-02-11 DIAGNOSIS — J449 Chronic obstructive pulmonary disease, unspecified: Secondary | ICD-10-CM | POA: Diagnosis not present

## 2023-02-11 DIAGNOSIS — E1151 Type 2 diabetes mellitus with diabetic peripheral angiopathy without gangrene: Secondary | ICD-10-CM | POA: Diagnosis not present

## 2023-02-12 ENCOUNTER — Telehealth: Payer: Self-pay

## 2023-02-12 ENCOUNTER — Other Ambulatory Visit: Payer: Self-pay

## 2023-02-12 DIAGNOSIS — E039 Hypothyroidism, unspecified: Secondary | ICD-10-CM

## 2023-02-12 MED ORDER — LEVOTHYROXINE SODIUM 50 MCG PO TABS: 50.0000 ug | ORAL_TABLET | Freq: Every day | ORAL | 1 refills | Status: DC

## 2023-02-12 NOTE — Telephone Encounter (Signed)
[facility] 

## 2023-02-12 NOTE — Telephone Encounter (Signed)
Pt is requesting a short supply of levothyroxine (SYNTHROID) 50 MCG tablet   Pharmacy: [id number] - [place], [place] - [address]    LOV 06/23/22 ROV 02/16/2023

## 2023-02-16 ENCOUNTER — Encounter: Payer: Self-pay | Admitting: Nurse Practitioner

## 2023-02-16 ENCOUNTER — Ambulatory Visit (INDEPENDENT_AMBULATORY_CARE_PROVIDER_SITE_OTHER): Payer: Medicare HMO | Admitting: Nurse Practitioner

## 2023-02-16 VITALS — BP 138/83 | HR 60 | Ht 61.02 in | Wt 152.1 lb

## 2023-02-16 DIAGNOSIS — I10 Essential (primary) hypertension: Secondary | ICD-10-CM | POA: Diagnosis not present

## 2023-02-16 DIAGNOSIS — M064 Inflammatory polyarthropathy: Secondary | ICD-10-CM | POA: Diagnosis not present

## 2023-02-16 DIAGNOSIS — E039 Hypothyroidism, unspecified: Secondary | ICD-10-CM

## 2023-02-16 DIAGNOSIS — E782 Mixed hyperlipidemia: Secondary | ICD-10-CM | POA: Diagnosis not present

## 2023-02-16 MED ORDER — CELECOXIB 100 MG PO CAPS: 100.0000 mg | ORAL_CAPSULE | Freq: Two times a day (BID) | ORAL | 2 refills | Status: DC | PRN

## 2023-02-16 NOTE — Progress Notes (Signed)
Established patient visit   Patient: Kaitlin Sharp   DOB: 10-24-1946   77 y.o. Female  MRN: [id number] Visit Date: 02/16/2023   Chief Complaint  Patient presents with   Medical Management of Chronic Issues   Subjective    HPI  Follow up  -hypertension  --generally well controlled  -hypothyroid  -mild hypercalcemia with recent labs  -mildly elevated triglycerides  Does have arthritic pain in hands and lower back.  -takes Tylenol arthritis for this. States that sometimes this works and sometimes it does not.  -she is a smoker. States that she smokes about 1/2 pack of cigarettes per day on average.   -she denies chest pain, chest pressure, or shortness of breath. she denies headaches or visual disturbances. She denies abdominal pain, nausea, vomiting, or changes in bowel or bladder habits.    Medications: Outpatient Medications Prior to Visit  Medication Sig   allopurinol (ZYLOPRIM) 100 MG tablet Take 1 tablet (100 mg total) by mouth daily.   lisinopril (ZESTRIL) 20 MG tablet Take 1 tablet (20 mg total) by mouth daily.   pravastatin (PRAVACHOL) 20 MG tablet Take 1 tablet (20 mg total) by mouth at bedtime.   [DISCONTINUED] levothyroxine (SYNTHROID) 50 MCG tablet Take 1 tablet (50 mcg total) by mouth daily.   [DISCONTINUED] spironolactone (ALDACTONE) 25 MG tablet Take 1 tablet (25 mg total) by mouth daily.   [DISCONTINUED] traMADol (ULTRAM) 50 MG tablet Take 1 tablet (50 mg total) by mouth every 6 (six) hours as needed. (Patient not taking: Reported on 04/22/2022)   No facility-administered medications prior to visit.    Review of Systems See HPI    Last CBC Lab Results  Component Value Date   WBC 7.5 02/24/2023   HGB 12.2 02/24/2023   HCT 36.2 02/24/2023   MCV 98 (H) 02/24/2023   MCH 33.2 (H) 02/24/2023   RDW 12.0 02/24/2023   PLT 168 02/24/2023   Last metabolic panel Lab Results  Component Value Date   GLUCOSE 114 (H) 02/24/2023   NA 142 02/24/2023   K 4.9  02/24/2023   CL 105 02/24/2023   CO2 19 (L) 02/24/2023   BUN 22 02/24/2023   CREATININE 1.27 (H) 02/24/2023   EGFR 44 (L) 02/24/2023   CALCIUM 10.2 02/24/2023   PROT 6.4 02/24/2023   ALBUMIN 4.4 02/24/2023   LABGLOB 2.0 02/24/2023   AGRATIO 2.2 02/24/2023   BILITOT 0.4 02/24/2023   ALKPHOS 82 02/24/2023   AST 20 02/24/2023   ALT 17 02/24/2023   Last lipids Lab Results  Component Value Date   CHOL 186 02/24/2023   HDL 44 02/24/2023   LDLCALC 115 (H) 02/24/2023   TRIG 154 (H) 02/24/2023   CHOLHDL 4.2 02/24/2023   Last hemoglobin A1c Lab Results  Component Value Date   HGBA1C 5.8 (H) 02/24/2023   Last thyroid functions Lab Results  Component Value Date   TSH 6.320 (H) 02/24/2023       Objective     Today's Vitals   02/16/23 1417  BP: 138/83  Pulse: 60  SpO2: 98%  Weight: 152 lb 1.9 oz (69 kg)  Height: 5' 1.02" (1.55 m)   Body mass index is 28.72 kg/m.  BP Readings from Last 3 Encounters:  02/16/23 138/83  06/23/22 116/69  04/22/22 121/77    Wt Readings from Last 3 Encounters:  03/03/23 152 lb (68.9 kg)  02/16/23 152 lb 1.9 oz (69 kg)  06/23/22 154 lb (69.9 kg)    Physical Exam Vitals  and nursing note reviewed.  Constitutional:      Appearance: Normal appearance. She is well-developed.  HENT:     Head: Normocephalic and atraumatic.     Nose: Nose normal.     Mouth/Throat:     Mouth: Mucous membranes are moist.     Pharynx: Oropharynx is clear.  Eyes:     Extraocular Movements: Extraocular movements intact.     Conjunctiva/sclera: Conjunctivae normal.     Pupils: Pupils are equal, round, and reactive to light.  Neck:     Vascular: No carotid bruit.  Cardiovascular:     Rate and Rhythm: Normal rate and regular rhythm.     Pulses: Normal pulses.     Heart sounds: Normal heart sounds.  Pulmonary:     Effort: Pulmonary effort is normal.     Breath sounds: Wheezing present.     Comments: Soft, expiratory wheezes present in bilateral lung  fields.  Abdominal:     Palpations: Abdomen is soft.  Musculoskeletal:        General: Normal range of motion.     Cervical back: Normal range of motion and neck supple.     Comments: Arthritic changes noted of the fingers of both hands.   Lymphadenopathy:     Cervical: No cervical adenopathy.  Skin:    General: Skin is warm and dry.     Capillary Refill: Capillary refill takes less than 2 seconds.  Neurological:     General: No focal deficit present.     Mental Status: She is alert and oriented to person, place, and time.  Psychiatric:        Mood and Affect: Mood normal.        Behavior: Behavior normal.        Thought Content: Thought content normal.        Judgment: Judgment normal.       Assessment & Plan    Inflammatory polyarthritis (HCC) -     Celecoxib; Take 1 capsule (100 mg total) by mouth 2 (two) times daily as needed.  Dispense: 60 capsule; Refill: 2     Return in about 6 months (around 08/18/2023) for medicare wellness. FBW in next week or so.         Carlean Jews, NP  [facility] [phone] (phone) [phone] (fax)  [facility]

## 2023-02-24 ENCOUNTER — Other Ambulatory Visit: Payer: Self-pay

## 2023-02-24 ENCOUNTER — Telehealth: Payer: Self-pay

## 2023-02-24 ENCOUNTER — Other Ambulatory Visit: Payer: Medicare HMO

## 2023-02-24 DIAGNOSIS — E039 Hypothyroidism, unspecified: Secondary | ICD-10-CM

## 2023-02-24 DIAGNOSIS — E663 Overweight: Secondary | ICD-10-CM | POA: Diagnosis not present

## 2023-02-24 DIAGNOSIS — I10 Essential (primary) hypertension: Secondary | ICD-10-CM | POA: Diagnosis not present

## 2023-02-24 DIAGNOSIS — R7309 Other abnormal glucose: Secondary | ICD-10-CM | POA: Diagnosis not present

## 2023-02-24 DIAGNOSIS — E782 Mixed hyperlipidemia: Secondary | ICD-10-CM

## 2023-02-24 DIAGNOSIS — Z0000 Encounter for general adult medical examination without abnormal findings: Secondary | ICD-10-CM

## 2023-02-24 DIAGNOSIS — R7303 Prediabetes: Secondary | ICD-10-CM | POA: Diagnosis not present

## 2023-02-24 MED ORDER — SPIRONOLACTONE 25 MG PO TABS: 25.0000 mg | ORAL_TABLET | Freq: Every day | ORAL | 3 refills | Status: DC

## 2023-02-24 NOTE — Telephone Encounter (Signed)
Rx has been sent  

## 2023-02-24 NOTE — Telephone Encounter (Signed)
Pt is requesting a New Rx for   spironolactone (ALDACTONE) 25 MG tablet    [id number] [place], [place] - [address]   [facility] pt last refill was 08/22/22

## 2023-02-25 ENCOUNTER — Other Ambulatory Visit: Payer: Self-pay | Admitting: Nurse Practitioner

## 2023-02-25 DIAGNOSIS — E039 Hypothyroidism, unspecified: Secondary | ICD-10-CM

## 2023-02-25 LAB — LIPID PANEL
Chol/HDL Ratio: 4.2 ratio (ref 0.0–4.4)
Cholesterol, Total: 186 mg/dL (ref 100–199)
HDL: 44 mg/dL (ref >39)
LDL Chol Calc (NIH): 115 mg/dL — ABNORMAL HIGH (ref 0–99)
Triglycerides: 154 mg/dL — ABNORMAL HIGH (ref 0–149)
VLDL Cholesterol Cal: 27 mg/dL (ref 5–40)

## 2023-02-25 LAB — CBC WITH DIFFERENTIAL/PLATELET
Basophils Absolute: 0.1 10*3/uL (ref 0.0–0.2)
Basos: 1 %
EOS (ABSOLUTE): 0.2 10*3/uL (ref 0.0–0.4)
Eos: 3 %
Hematocrit: 36.2 % (ref 34.0–46.6)
Hemoglobin: 12.2 g/dL (ref 11.1–15.9)
Immature Grans (Abs): 0 10*3/uL (ref 0.0–0.1)
Immature Granulocytes: 0 %
Lymphocytes Absolute: 2.5 10*3/uL (ref 0.7–3.1)
Lymphs: 34 %
MCH: 33.2 pg — ABNORMAL HIGH (ref 26.6–33.0)
MCHC: 33.7 g/dL (ref 31.5–35.7)
MCV: 98 fL — ABNORMAL HIGH (ref 79–97)
Monocytes Absolute: 0.7 10*3/uL (ref 0.1–0.9)
Monocytes: 9 %
Neutrophils Absolute: 4 10*3/uL (ref 1.4–7.0)
Neutrophils: 53 %
Platelets: 168 10*3/uL (ref 150–450)
RBC: 3.68 x10E6/uL — ABNORMAL LOW (ref 3.77–5.28)
RDW: 12 % (ref 11.7–15.4)
WBC: 7.5 10*3/uL (ref 3.4–10.8)

## 2023-02-25 LAB — COMPREHENSIVE METABOLIC PANEL
ALT: 17 IU/L (ref 0–32)
AST: 20 IU/L (ref 0–40)
Albumin/Globulin Ratio: 2.2 (ref 1.2–2.2)
Albumin: 4.4 g/dL (ref 3.8–4.8)
Alkaline Phosphatase: 82 IU/L (ref 44–121)
BUN/Creatinine Ratio: 17 (ref 12–28)
BUN: 22 mg/dL (ref 8–27)
Bilirubin Total: 0.4 mg/dL (ref 0.0–1.2)
CO2: 19 mmol/L — ABNORMAL LOW (ref 20–29)
Calcium: 10.2 mg/dL (ref 8.7–10.3)
Chloride: 105 mmol/L (ref 96–106)
Creatinine, Ser: 1.27 mg/dL — ABNORMAL HIGH (ref 0.57–1.00)
Globulin, Total: 2 g/dL (ref 1.5–4.5)
Glucose: 114 mg/dL — ABNORMAL HIGH (ref 70–99)
Potassium: 4.9 mmol/L (ref 3.5–5.2)
Sodium: 142 mmol/L (ref 134–144)
Total Protein: 6.4 g/dL (ref 6.0–8.5)
eGFR: 44 mL/min/{1.73_m2} — ABNORMAL LOW (ref >59)

## 2023-02-25 LAB — TSH: TSH: 6.32 u[IU]/mL — ABNORMAL HIGH (ref 0.450–4.500)

## 2023-02-25 LAB — HEMOGLOBIN A1C
Est. average glucose Bld gHb Est-mCnc: 120 mg/dL
Hgb A1c MFr Bld: 5.8 % — ABNORMAL HIGH (ref 4.8–5.6)

## 2023-02-25 MED ORDER — LEVOTHYROXINE SODIUM 75 MCG PO TABS: 75.0000 ug | ORAL_TABLET | Freq: Every day | ORAL | 1 refills | Status: DC

## 2023-02-25 NOTE — Progress Notes (Signed)
Please let the patient know that I have increased her levothyroxine dose to 75 mcg daily. Her other labs were stable. We should recheck her thyroid panel at her next visit.  Thanks so much.   -HB

## 2023-03-03 ENCOUNTER — Ambulatory Visit (INDEPENDENT_AMBULATORY_CARE_PROVIDER_SITE_OTHER): Payer: Medicare HMO

## 2023-03-03 VITALS — Ht 61.0 in | Wt 152.0 lb

## 2023-03-03 DIAGNOSIS — Z0000 Encounter for general adult medical examination without abnormal findings: Secondary | ICD-10-CM | POA: Diagnosis not present

## 2023-03-03 NOTE — Patient Instructions (Addendum)
Kaitlin Sharp , Thank you for taking time to come for your Medicare Wellness Visit. I appreciate your ongoing commitment to your health goals. Please review the following plan we discussed and let me know if I can assist you in the future.   These are the goals we discussed:  Goals       Stay healthy (pt-stated)      Get off medications if possible!        This is a list of the screening recommended for you and due dates:  Health Maintenance  Topic Date Due   DTaP/Tdap/Td vaccine (1 - Tdap) Never done   COVID-19 Vaccine (3 - Pfizer risk series) 03/19/2023*   Hepatitis C Screening: USPSTF Recommendation to screen - Ages 18-79 yo.  03/02/2024*   Flu Shot  05/28/2023   Medicare Annual Wellness Visit  03/02/2024   Colon Cancer Screening  08/02/2031   Pneumonia Vaccine  Completed   DEXA scan (bone density measurement)  Completed   Zoster (Shingles) Vaccine  Completed   HPV Vaccine  Aged Out  *Topic was postponed. The date shown is not the original due date.    Advanced directives: Please bring a copy of your health care power of attorney and living will to the office to be added to your chart at your convenience.   Conditions/risks identified: None  Next appointment: Follow up in one year for your annual wellness visit    Preventive Care 65 Years and Older, Female Preventive care refers to lifestyle choices and visits with your health care provider that can promote health and wellness. What does preventive care include? A yearly physical exam. This is also called an annual well check. Dental exams once or twice a year. Routine eye exams. Ask your health care provider how often you should have your eyes checked. Personal lifestyle choices, including: Daily care of your teeth and gums. Regular physical activity. Eating a healthy diet. Avoiding tobacco and drug use. Limiting alcohol use. Practicing safe sex. Taking low-dose aspirin every day. Taking vitamin and mineral  supplements as recommended by your health care provider. What happens during an annual well check? The services and screenings done by your health care provider during your annual well check will depend on your age, overall health, lifestyle risk factors, and family history of disease. Counseling  Your health care provider may ask you questions about your: Alcohol use. Tobacco use. Drug use. Emotional well-being. Home and relationship well-being. Sexual activity. Eating habits. History of falls. Memory and ability to understand (cognition). Work and work Astronomer. Reproductive health. Screening  You may have the following tests or measurements: Height, weight, and BMI. Blood pressure. Lipid and cholesterol levels. These may be checked every 5 years, or more frequently if you are over 24 years old. Skin check. Lung cancer screening. You may have this screening every year starting at age 30 if you have a 30-pack-year history of smoking and currently smoke or have quit within the past 15 years. Fecal occult blood test (FOBT) of the stool. You may have this test every year starting at age 51. Flexible sigmoidoscopy or colonoscopy. You may have a sigmoidoscopy every 5 years or a colonoscopy every 10 years starting at age 18. Hepatitis C blood test. Hepatitis B blood test. Sexually transmitted disease (STD) testing. Diabetes screening. This is done by checking your blood sugar (glucose) after you have not eaten for a while (fasting). You may have this done every 1-3 years. Bone density scan. This is  done to screen for osteoporosis. You may have this done starting at age 42. Mammogram. This may be done every 1-2 years. Talk to your health care provider about how often you should have regular mammograms. Talk with your health care provider about your test results, treatment options, and if necessary, the need for more tests. Vaccines  Your health care provider may recommend certain  vaccines, such as: Influenza vaccine. This is recommended every year. Tetanus, diphtheria, and acellular pertussis (Tdap, Td) vaccine. You may need a Td booster every 10 years. Zoster vaccine. You may need this after age 18. Pneumococcal 13-valent conjugate (PCV13) vaccine. One dose is recommended after age 6. Pneumococcal polysaccharide (PPSV23) vaccine. One dose is recommended after age 34. Talk to your health care provider about which screenings and vaccines you need and how often you need them. This information is not intended to replace advice given to you by your health care provider. Make sure you discuss any questions you have with your health care provider. Document Released: 11/09/2015 Document Revised: 07/02/2016 Document Reviewed: 08/14/2015 Elsevier Interactive Patient Education  2017 [place] Prevention in the Home Falls can cause injuries. They can happen to people of all ages. There are many things you can do to make your home safe and to help prevent falls. What can I do on the outside of my home? Regularly fix the edges of walkways and driveways and fix any cracks. Remove anything that might make you trip as you walk through a door, such as a raised step or threshold. Trim any bushes or trees on the path to your home. Use bright outdoor lighting. Clear any walking paths of anything that might make someone trip, such as rocks or tools. Regularly check to see if handrails are loose or broken. Make sure that both sides of any steps have handrails. Any raised decks and porches should have guardrails on the edges. Have any leaves, snow, or ice cleared regularly. Use sand or salt on walking paths during winter. Clean up any spills in your garage right away. This includes oil or grease spills. What can I do in the bathroom? Use night lights. Install grab bars by the toilet and in the tub and shower. Do not use towel bars as grab bars. Use non-skid mats or decals in  the tub or shower. If you need to sit down in the shower, use a plastic, non-slip stool. Keep the floor dry. Clean up any water that spills on the floor as soon as it happens. Remove soap buildup in the tub or shower regularly. Attach bath mats securely with double-sided non-slip rug tape. Do not have throw rugs and other things on the floor that can make you trip. What can I do in the bedroom? Use night lights. Make sure that you have a light by your bed that is easy to reach. Do not use any sheets or blankets that are too big for your bed. They should not hang down onto the floor. Have a firm chair that has side arms. You can use this for support while you get dressed. Do not have throw rugs and other things on the floor that can make you trip. What can I do in the kitchen? Clean up any spills right away. Avoid walking on wet floors. Keep items that you use a lot in easy-to-reach places. If you need to reach something above you, use a strong step stool that has a grab bar. Keep electrical cords out of  the way. Do not use floor polish or wax that makes floors slippery. If you must use wax, use non-skid floor wax. Do not have throw rugs and other things on the floor that can make you trip. What can I do with my stairs? Do not leave any items on the stairs. Make sure that there are handrails on both sides of the stairs and use them. Fix handrails that are broken or loose. Make sure that handrails are as long as the stairways. Check any carpeting to make sure that it is firmly attached to the stairs. Fix any carpet that is loose or worn. Avoid having throw rugs at the top or bottom of the stairs. If you do have throw rugs, attach them to the floor with carpet tape. Make sure that you have a light switch at the top of the stairs and the bottom of the stairs. If you do not have them, ask someone to add them for you. What else can I do to help prevent falls? Wear shoes that: Do not have high  heels. Have rubber bottoms. Are comfortable and fit you well. Are closed at the toe. Do not wear sandals. If you use a stepladder: Make sure that it is fully opened. Do not climb a closed stepladder. Make sure that both sides of the stepladder are locked into place. Ask someone to hold it for you, if possible. Clearly mark and make sure that you can see: Any grab bars or handrails. First and last steps. Where the edge of each step is. Use tools that help you move around (mobility aids) if they are needed. These include: Canes. Walkers. Scooters. Crutches. Turn on the lights when you go into a dark area. Replace any light bulbs as soon as they burn out. Set up your furniture so you have a clear path. Avoid moving your furniture around. If any of your floors are uneven, fix them. If there are any pets around you, be aware of where they are. Review your medicines with your doctor. Some medicines can make you feel dizzy. This can increase your chance of falling. Ask your doctor what other things that you can do to help prevent falls. This information is not intended to replace advice given to you by your health care provider. Make sure you discuss any questions you have with your health care provider. Document Released: 08/09/2009 Document Revised: 03/20/2016 Document Reviewed: 11/17/2014 Elsevier Interactive Patient Education  2017 [employer].

## 2023-03-03 NOTE — Progress Notes (Signed)
Subjective:   Kaitlin Sharp is a 77 y.o. female who presents for Medicare Annual (Subsequent) preventive examination.  Review of Systems   Virtual Visit via Telephone Note  I connected with  Kaitlin Sharp on 03/03/23 at  3:30 PM EDT by telephone and verified that I am speaking with the correct person using two identifiers.  Location: Patient: Home Provider: Office Persons participating in the virtual visit: patient/Nurse Health Advisor   I discussed the limitations, risks, security and privacy concerns of performing an evaluation and management service by telephone and the availability of in person appointments. The patient expressed understanding and agreed to proceed.  Interactive audio and video telecommunications were attempted between this nurse and patient, however failed, due to patient having technical difficulties OR patient did not have access to video capability.  We continued and completed visit with audio only.  Some vital signs may be absent or patient reported.   Kaitlin Rung, LPN  Cardiac Risk Factors include: advanced age (>6men, >40 women);hypertension     Objective:    Today's Vitals   03/03/23 1539  Weight: 152 lb (68.9 kg)  Height: 5\' 1"  (1.549 m)   Body mass index is 28.72 kg/m.     03/03/2023    3:50 PM  Advanced Directives  Does Patient Have a Medical Advance Directive? Yes  Type of Estate agent of [place];Living will  Copy of Healthcare Power of Attorney in Chart? No - copy requested    Current Medications (verified) Outpatient Encounter Medications as of 03/03/2023  Medication Sig   allopurinol (ZYLOPRIM) 100 MG tablet Take 1 tablet (100 mg total) by mouth daily.   celecoxib (CELEBREX) 100 MG capsule Take 1 capsule (100 mg total) by mouth 2 (two) times daily as needed.   levothyroxine (SYNTHROID) 75 MCG tablet Take 1 tablet (75 mcg total) by mouth daily.   lisinopril (ZESTRIL) 20 MG tablet Take 1  tablet (20 mg total) by mouth daily.   pravastatin (PRAVACHOL) 20 MG tablet Take 1 tablet (20 mg total) by mouth at bedtime.   spironolactone (ALDACTONE) 25 MG tablet Take 1 tablet (25 mg total) by mouth daily.   No facility-administered encounter medications on file as of 03/03/2023.    Allergies (verified) Patient has no known allergies.   History: Past Medical History:  Diagnosis Date   Dyslipidemia    Hypertension    Kidney calculi    Past Surgical History:  Procedure Laterality Date   LITHOTRIPSY     Family History  Problem Relation Age of Onset   Brain cancer Father    Heart attack Brother    Social History   Socioeconomic History   Marital status: Single    Spouse name: Not on file   Number of children: Not on file   Years of education: Not on file   Highest education level: Not on file  Occupational History   Not on file  Tobacco Use   Smoking status: Every Day    Types: Cigarettes   Smokeless tobacco: Never  Vaping Use   Vaping Use: Not on file  Substance and Sexual Activity   Alcohol use: Yes   Drug use: Never   Sexual activity: Not Currently  Other Topics Concern   Not on file  Social History Narrative   Not on file   Social Determinants of Health   Financial Resource Strain: Low Risk  (03/03/2023)   Overall Financial Resource Strain (CARDIA)    Difficulty of  Paying Living Expenses: Not hard at all  Food Insecurity: No Food Insecurity (03/03/2023)   Hunger Vital Sign    Worried About Running Out of Food in the Last Year: Never true    Ran Out of Food in the Last Year: Never true  Transportation Needs: No Transportation Needs (03/03/2023)   PRAPARE - Administrator, Civil Service (Medical): No    Lack of Transportation (Non-Medical): No  Physical Activity: Inactive (03/03/2023)   Exercise Vital Sign    Days of Exercise per Week: 0 days    Minutes of Exercise per Session: 0 min  Stress: No Stress Concern Present (03/03/2023)   [employer] of Occupational Health - Occupational Stress Questionnaire    Feeling of Stress : Not at all  Social Connections: Socially Isolated (03/03/2023)   Social Connection and Isolation Panel [NHANES]    Frequency of Communication with Friends and Family: More than three times a week    Frequency of Social Gatherings with Friends and Family: More than three times a week    Attends Religious Services: Never    Database administrator or Organizations: No    Attends Engineer, structural: Never    Marital Status: Divorced    Tobacco Counseling Ready to quit: No Counseling given: Yes   Clinical Intake:  Pre-visit preparation completed: No  Pain : No/denies pain     BMI - recorded: 28.72 Nutritional Status: BMI 25 -29 Overweight Nutritional Risks: None Diabetes: No  How often do you need to have someone help you when you read instructions, pamphlets, or other written materials from your doctor or pharmacy?: 1 - Never  Diabetic?  No  Interpreter Needed?: No  Information entered by :: Kaitlin Mulligan LPN   Activities of Daily Living    03/03/2023    3:46 PM 04/22/2022    3:29 PM  In your present state of health, do you have any difficulty performing the following activities:  Hearing? 0 0  Vision? 0 0  Difficulty concentrating or making decisions? 0 0  Walking or climbing stairs? 0 1  Dressing or bathing? 0 0  Doing errands, shopping? 0 0  Preparing Food and eating ? N   Using the Toilet? N   In the past six months, have you accidently leaked urine? N   Do you have problems with loss of bowel control? N   Managing your Medications? N   Managing your Finances? N   Housekeeping or managing your Housekeeping? N     Patient Care Team: Kaitlin Jews, NP as PCP - General (Family Medicine)  Indicate any recent Medical Services you may have received from other than [facility] providers in the past year (date may be approximate).     Assessment:   This is a  routine wellness examination for [name].  Hearing/Vision screen Hearing Screening - Comments:: Denies hearing difficulties   Vision Screening - Comments:: Wears rx glasses - up to date with routine eye exams with  [facility]  Dietary issues and exercise activities discussed: Exercise limited by: None identified   Goals Addressed               This Visit's Progress     Stay healthy (pt-stated)        Get off medications if possible!       Depression Screen    03/03/2023    3:44 PM 02/16/2023    2:19 PM 06/23/2022    9:45  AM 04/22/2022    3:29 PM  PHQ 2/9 Scores  PHQ - 2 Score 0 0 0 0  PHQ- 9 Score 0 1 4 2     Fall Risk    03/03/2023    3:47 PM 02/16/2023    2:18 PM 06/23/2022    9:49 AM  Fall Risk   Falls in the past year? 0 0 0  Number falls in past yr: 0 0 0  Injury with Fall? 0 0 0  Risk for fall due to : No Fall Risks No Fall Risks No Fall Risks  Follow up Falls prevention discussed Falls evaluation completed Falls evaluation completed    FALL RISK PREVENTION PERTAINING TO THE HOME:  Any stairs in or around the home? No  If so, are there any without handrails? No  Home free of loose throw rugs in walkways, pet beds, electrical cords, etc? Yes  Adequate lighting in your home to reduce risk of falls? Yes   ASSISTIVE DEVICES UTILIZED TO PREVENT FALLS:  Life alert? No  Use of a cane, walker or w/c? Yes  Grab bars in the bathroom? Yes  Shower chair or bench in shower? Yes  Elevated toilet seat or a handicapped toilet? No   TIMED UP AND GO:  Was the test performed? No . Audio Visit   Cognitive Function:        03/03/2023    3:50 PM 06/23/2022    9:49 AM  6CIT Screen  What Year? 0 points 0 points  What month? 0 points 0 points  What time? 0 points   Count back from 20 0 points 0 points  Months in reverse 0 points 0 points  Repeat phrase 0 points 0 points  Total Score 0 points     Immunizations Immunization History  Administered Date(s)  Administered   Influenza, Quadrivalent, Recombinant, Inj, Pf 07/20/2021   PFIZER(Purple Top)SARS-COV-2 Vaccination 01/08/2020, 01/30/2020   PNEUMOCOCCAL CONJUGATE-20 07/20/2021   Zoster Recombinat (Shingrix) 05/22/2018, 08/16/2018    TDAP status: Due, Education has been provided regarding the importance of this vaccine. Advised may receive this vaccine at local pharmacy or Health Dept. Aware to provide a copy of the vaccination record if obtained from local pharmacy or Health Dept. Verbalized acceptance and understanding.  Flu Vaccine status: Up to date  Pneumococcal vaccine status: Up to date  Covid-19 vaccine status: Completed vaccines  Qualifies for Shingles Vaccine? Yes   Zostavax completed Yes   Shingrix Completed?: Yes  Screening Tests Health Maintenance  Topic Date Due   DTaP/Tdap/Td (1 - Tdap) Never done   COVID-19 Vaccine (3 - Pfizer risk series) 03/19/2023 (Originally 02/27/2020)   Hepatitis C Screening  03/02/2024 (Originally 05/20/1964)   INFLUENZA VACCINE  05/28/2023   Medicare Annual Wellness (AWV)  03/02/2024   COLONOSCOPY (Pts 45-14yrs Insurance coverage will need to be confirmed)  08/02/2031   Pneumonia Vaccine 61+ Years old  Completed   DEXA SCAN  Completed   Zoster Vaccines- Shingrix  Completed   HPV VACCINES  Aged Out    Health Maintenance  Health Maintenance Due  Topic Date Due   DTaP/Tdap/Td (1 - Tdap) Never done    Colorectal cancer screening: Type of screening: Colonoscopy. Completed 08/01/20. Repeat every 10 years  Mammogram status: No longer required due to Age.  Bone Density status: Completed 06/10/11. Results reflect: Bone density results: OSTEOPOROSIS. Repeat every   years.  Lung Cancer Screening: (Low Dose CT Chest recommended if Age 70-80 years, 30 pack-year currently smoking OR  have quit w/in 15years.) does qualify.   Lung Cancer Screening Referral: Deferred  Additional Screening:  Hepatitis C Screening: does qualify;  Deferred  Vision  Screening: Recommended annual ophthalmology exams for early detection of glaucoma and other disorders of the eye. Is the patient up to date with their annual eye exam?  Yes  Who is the provider or what is the name of the office in which the patient attends annual eye exams? [facility] If pt is not established with a provider, would they like to be referred to a provider to establish care? No .   Dental Screening: Recommended annual dental exams for proper oral hygiene  Community Resource Referral / Chronic Care Management:  CRR required this visit?  No   CCM required this visit?  No      Plan:     I have personally reviewed and noted the following in the patient's chart:   Medical and social history Use of alcohol, tobacco or illicit drugs  Current medications and supplements including opioid prescriptions. Patient is not currently taking opioid prescriptions. Functional ability and status Nutritional status Physical activity Advanced directives List of other physicians Hospitalizations, surgeries, and ER visits in previous 12 months Vitals Screenings to include cognitive, depression, and falls Referrals and appointments  In addition, I have reviewed and discussed with patient certain preventive protocols, quality metrics, and best practice recommendations. A written personalized care plan for preventive services as well as general preventive health recommendations were provided to patient.     Kaitlin Rung, LPN   [id number]   Nurse Notes: Patient due Hep-C Screening

## 2023-03-12 NOTE — Assessment & Plan Note (Signed)
Trial celebrex 100 mg twice daily  May continue tylenol arthritis as needed  Reassess in 6 months and sooner if needed

## 2023-03-12 NOTE — Assessment & Plan Note (Signed)
Check fasting lipids and adjust treatment as indicated

## 2023-03-12 NOTE — Assessment & Plan Note (Signed)
Generally stable. Continue current medication.  

## 2023-03-12 NOTE — Assessment & Plan Note (Signed)
Recheck CMP for further evaluation

## 2023-03-12 NOTE — Assessment & Plan Note (Signed)
Check thyroid panel  -adjust dose levothyroxine as indicated

## 2023-03-26 NOTE — Addendum Note (Signed)
Addended by: Vincent Gros on: 03/26/2023 08:31 AM   Modules accepted: Level of Service

## 2023-04-27 ENCOUNTER — Encounter: Payer: Self-pay | Admitting: Family Medicine

## 2023-04-27 ENCOUNTER — Other Ambulatory Visit: Payer: Self-pay | Admitting: Nurse Practitioner

## 2023-04-27 DIAGNOSIS — E039 Hypothyroidism, unspecified: Secondary | ICD-10-CM

## 2023-04-27 DIAGNOSIS — M1A09X0 Idiopathic chronic gout, multiple sites, without tophus (tophi): Secondary | ICD-10-CM

## 2023-05-18 ENCOUNTER — Other Ambulatory Visit: Payer: Self-pay | Admitting: Nurse Practitioner

## 2023-05-18 DIAGNOSIS — E039 Hypothyroidism, unspecified: Secondary | ICD-10-CM

## 2023-05-26 ENCOUNTER — Other Ambulatory Visit: Payer: Self-pay | Admitting: Nurse Practitioner

## 2023-05-26 DIAGNOSIS — E039 Hypothyroidism, unspecified: Secondary | ICD-10-CM

## 2023-05-29 ENCOUNTER — Other Ambulatory Visit: Payer: Self-pay | Admitting: Nurse Practitioner

## 2023-05-29 DIAGNOSIS — I10 Essential (primary) hypertension: Secondary | ICD-10-CM

## 2023-08-10 ENCOUNTER — Other Ambulatory Visit: Payer: Self-pay

## 2023-08-10 DIAGNOSIS — E782 Mixed hyperlipidemia: Secondary | ICD-10-CM

## 2023-08-10 DIAGNOSIS — I10 Essential (primary) hypertension: Secondary | ICD-10-CM

## 2023-08-10 DIAGNOSIS — E039 Hypothyroidism, unspecified: Secondary | ICD-10-CM

## 2023-08-10 DIAGNOSIS — R7303 Prediabetes: Secondary | ICD-10-CM

## 2023-08-11 ENCOUNTER — Other Ambulatory Visit: Payer: Medicare HMO

## 2023-08-11 DIAGNOSIS — E782 Mixed hyperlipidemia: Secondary | ICD-10-CM | POA: Diagnosis not present

## 2023-08-11 DIAGNOSIS — I10 Essential (primary) hypertension: Secondary | ICD-10-CM

## 2023-08-11 DIAGNOSIS — R7303 Prediabetes: Secondary | ICD-10-CM | POA: Diagnosis not present

## 2023-08-11 DIAGNOSIS — E039 Hypothyroidism, unspecified: Secondary | ICD-10-CM

## 2023-08-12 LAB — TSH: TSH: 2.57 u[IU]/mL (ref 0.450–4.500)

## 2023-08-12 LAB — CBC WITH DIFFERENTIAL/PLATELET
Basophils Absolute: 0.1 10*3/uL (ref 0.0–0.2)
Basos: 1 %
EOS (ABSOLUTE): 0.2 10*3/uL (ref 0.0–0.4)
Eos: 3 %
Hematocrit: 37 % (ref 34.0–46.6)
Hemoglobin: 12.3 g/dL (ref 11.1–15.9)
Immature Grans (Abs): 0 10*3/uL (ref 0.0–0.1)
Immature Granulocytes: 0 %
Lymphocytes Absolute: 2.3 10*3/uL (ref 0.7–3.1)
Lymphs: 32 %
MCH: 33.2 pg — ABNORMAL HIGH (ref 26.6–33.0)
MCHC: 33.2 g/dL (ref 31.5–35.7)
MCV: 100 fL — ABNORMAL HIGH (ref 79–97)
Monocytes Absolute: 0.6 10*3/uL (ref 0.1–0.9)
Monocytes: 8 %
Neutrophils Absolute: 4.1 10*3/uL (ref 1.4–7.0)
Neutrophils: 56 %
Platelets: 162 10*3/uL (ref 150–450)
RBC: 3.7 x10E6/uL — ABNORMAL LOW (ref 3.77–5.28)
RDW: 12.4 % (ref 11.7–15.4)
WBC: 7.3 10*3/uL (ref 3.4–10.8)

## 2023-08-12 LAB — HEMOGLOBIN A1C
Est. average glucose Bld gHb Est-mCnc: 117 mg/dL
Hgb A1c MFr Bld: 5.7 % — ABNORMAL HIGH (ref 4.8–5.6)

## 2023-08-12 LAB — COMPREHENSIVE METABOLIC PANEL
ALT: 14 [IU]/L (ref 0–32)
AST: 20 [IU]/L (ref 0–40)
Albumin: 4.2 g/dL (ref 3.8–4.8)
Alkaline Phosphatase: 91 [IU]/L (ref 44–121)
BUN/Creatinine Ratio: 19 (ref 12–28)
BUN: 25 mg/dL (ref 8–27)
Bilirubin Total: 0.6 mg/dL (ref 0.0–1.2)
CO2: 22 mmol/L (ref 20–29)
Calcium: 10 mg/dL (ref 8.7–10.3)
Chloride: 106 mmol/L (ref 96–106)
Creatinine, Ser: 1.35 mg/dL — ABNORMAL HIGH (ref 0.57–1.00)
Globulin, Total: 2.2 g/dL (ref 1.5–4.5)
Glucose: 110 mg/dL — ABNORMAL HIGH (ref 70–99)
Potassium: 5 mmol/L (ref 3.5–5.2)
Sodium: 140 mmol/L (ref 134–144)
Total Protein: 6.4 g/dL (ref 6.0–8.5)
eGFR: 41 mL/min/{1.73_m2} — ABNORMAL LOW (ref >59)

## 2023-08-12 LAB — LIPID PANEL
Chol/HDL Ratio: 3.5 {ratio} (ref 0.0–4.4)
Cholesterol, Total: 160 mg/dL (ref 100–199)
HDL: 46 mg/dL (ref >39)
LDL Chol Calc (NIH): 93 mg/dL (ref 0–99)
Triglycerides: 115 mg/dL (ref 0–149)
VLDL Cholesterol Cal: 21 mg/dL (ref 5–40)

## 2023-08-17 ENCOUNTER — Other Ambulatory Visit: Payer: Self-pay | Admitting: Nurse Practitioner

## 2023-08-17 DIAGNOSIS — E782 Mixed hyperlipidemia: Secondary | ICD-10-CM

## 2023-08-18 ENCOUNTER — Ambulatory Visit (INDEPENDENT_AMBULATORY_CARE_PROVIDER_SITE_OTHER): Payer: Medicare HMO | Admitting: Family Medicine

## 2023-08-18 ENCOUNTER — Ambulatory Visit: Payer: Medicare HMO | Admitting: Nurse Practitioner

## 2023-08-18 ENCOUNTER — Encounter: Payer: Self-pay | Admitting: Family Medicine

## 2023-08-18 VITALS — BP 132/75 | HR 61 | Ht 61.0 in | Wt 145.8 lb

## 2023-08-18 DIAGNOSIS — I10 Essential (primary) hypertension: Secondary | ICD-10-CM

## 2023-08-18 DIAGNOSIS — M064 Inflammatory polyarthropathy: Secondary | ICD-10-CM | POA: Diagnosis not present

## 2023-08-18 DIAGNOSIS — M1A00X0 Idiopathic chronic gout, unspecified site, without tophus (tophi): Secondary | ICD-10-CM | POA: Diagnosis not present

## 2023-08-18 DIAGNOSIS — N1831 Chronic kidney disease, stage 3a: Secondary | ICD-10-CM | POA: Insufficient documentation

## 2023-08-18 DIAGNOSIS — E782 Mixed hyperlipidemia: Secondary | ICD-10-CM

## 2023-08-18 DIAGNOSIS — Z23 Encounter for immunization: Secondary | ICD-10-CM

## 2023-08-18 NOTE — Assessment & Plan Note (Addendum)
I do not see where she has have her been tested for rheumatoid arthritis.  She has complaints of several joints in her hands with pain along with the wrist and knees.  Did not tolerate Celebrex.  Using Tylenol and Voltaren.  Will test for RF and ESR, CRP

## 2023-08-18 NOTE — Patient Instructions (Addendum)
It was nice to see you today,  We addressed the following topics today: -Continue taking your medications as prescribed.  No changes were made today. - I will order some test to help rule out other causes of your arthritis and also other causes of your chronic kidney disease - I would like to see back in 6 months - For your arthritis pain you can take Tylenol 1000 mg every 8 hours and also use Voltaren gel in addition to other creams applied topically such as lidocaine cream, menthol containing creams.  Have a great day,  Frederic Jericho, MD

## 2023-08-18 NOTE — Assessment & Plan Note (Signed)
Tolerating blood pressure medication.  Blood pressure well-controlled.  Continue current medication.

## 2023-08-18 NOTE — Assessment & Plan Note (Signed)
Recent cholesterol level normal.  Continue Pravachol.

## 2023-08-18 NOTE — Assessment & Plan Note (Signed)
Continue allopurinol.  No gout flares in several years.  Will need to monitor kidney function in the future and adjust dose if needed.  Currently she has a GFR greater than 40.

## 2023-08-18 NOTE — Assessment & Plan Note (Addendum)
I only see records going back 2 years but all of them has showed a GFR between 40 and 50.  Will repeat this and get cystatin c at the  same time.

## 2023-08-18 NOTE — Progress Notes (Unsigned)
Established Patient Office Visit  Subjective   Patient ID: Kaitlin Sharp, female    DOB: [birth year]  Age: 77 y.o. MRN: [id number]  Chief Complaint  Patient presents with   Medical Management of Chronic Issues    HPI Patient is compliant with her medications.  She takes lisinopril and spironolactone for blood pressure, takes allopurinol for gout.  Has not had any gout flares in quite some time.  Takes Pravachol for hyperlipidemia.  She takes Synthroid for hypothyroidism but is unsure if she takes 50 mcg or 75 mcg.  We discussed her lab results being mostly normal or stable..  Patient states that her arthritis is getting worse.  Is mostly in the knees wrists and hands.  She stopped taking Celebrex because of that she did not like the way it made her feel.  Could not describe specifically how made her feel but did not like it.  Is currently taking Tylenol arthritis and using Voltaren gel.  We discussed the patient's kidney function and evidence of CKD.  Discussed with the typical causes are.  Discussed how sometimes autoimmune issues can be the cause.  Discussed autoimmune labs for workup of her arthritis and CKD.  She does not have diabetes and blood pressure is well-controlled.   The 10-year ASCVD risk score (Arnett DK, et al., 2019) is: 53.6%  Health Maintenance Due  Topic Date Due   Hepatitis C Screening  Never done   DTaP/Tdap/Td (1 - Tdap) Never done   COVID-19 Vaccine (3 - Pfizer risk series) 02/27/2020      Objective:     BP 132/75   Pulse 61   Ht 5\' 1"  (1.549 m)   Wt 145 lb 12.8 oz (66.1 kg)   SpO2 99%   BMI 27.55 kg/m  {Vitals History (Optional):23777}  Physical Exam General: Alert, oriented CV: Regular rate and rhythm Pulmonary: Lungs are bilaterally Psych: Pleasant affect.   No results found for any visits on 08/18/23.      Assessment & Plan:   Inflammatory polyarthritis (HCC) Assessment & Plan: I do not see where she has have her been tested  for rheumatoid arthritis.  She has complaints of several joints in her hands with pain along with the wrist and knees.  Did not tolerate Celebrex.  Using Tylenol and Voltaren.  Will test for RF and ESR, CRP  Orders: -     ANA w/Reflex if Positive -     Anti-CCP Ab, IgG + IgA (RDL) -     Rheumatoid factor -     Sedimentation rate -     C-reactive protein  Need for influenza vaccination -     Flu Vaccine Trivalent High Dose (Fluad)  Mixed hyperlipidemia Assessment & Plan: Recent cholesterol level normal.  Continue Pravachol.   Chronic idiopathic gout Assessment & Plan: Continue allopurinol.  No gout flares in several years.  Will need to monitor kidney function in the future and adjust dose if needed.  Currently she has a GFR greater than 40.   Stage 3a chronic kidney disease (HCC) Assessment & Plan: I only see records going back 2 years but all of them has showed a GFR between 40 and 50.  Will repeat this and get cystatin c at the  same time.   Essential hypertension Assessment & Plan: Tolerating blood pressure medication.  Blood pressure well-controlled.  Continue current medication.      Return in about 6 months (around 02/16/2024) for HTN, hld.    Reuel Boom  Anselm Jungling, MD

## 2023-08-21 ENCOUNTER — Other Ambulatory Visit: Payer: Self-pay | Admitting: Nurse Practitioner

## 2023-08-21 ENCOUNTER — Encounter: Payer: Self-pay | Admitting: Family Medicine

## 2023-08-21 ENCOUNTER — Other Ambulatory Visit: Payer: Self-pay | Admitting: Family Medicine

## 2023-08-21 DIAGNOSIS — I10 Essential (primary) hypertension: Secondary | ICD-10-CM

## 2023-08-21 MED ORDER — LISINOPRIL 20 MG PO TABS: 20.0000 mg | ORAL_TABLET | Freq: Every day | ORAL | 3 refills | Status: DC

## 2023-08-21 NOTE — Telephone Encounter (Signed)
Refill sent in

## 2023-08-24 ENCOUNTER — Other Ambulatory Visit: Payer: Self-pay | Admitting: Family Medicine

## 2023-08-24 DIAGNOSIS — I10 Essential (primary) hypertension: Secondary | ICD-10-CM

## 2023-08-27 ENCOUNTER — Other Ambulatory Visit: Payer: Medicare HMO

## 2023-08-27 DIAGNOSIS — M1A00X0 Idiopathic chronic gout, unspecified site, without tophus (tophi): Secondary | ICD-10-CM

## 2023-08-27 DIAGNOSIS — M064 Inflammatory polyarthropathy: Secondary | ICD-10-CM

## 2023-08-27 DIAGNOSIS — N1831 Chronic kidney disease, stage 3a: Secondary | ICD-10-CM

## 2023-09-03 LAB — ANA W/REFLEX IF POSITIVE: Anti Nuclear Antibody (ANA): NEGATIVE

## 2023-09-03 LAB — RHEUMATOID FACTOR: Rheumatoid fact SerPl-aCnc: <10 [IU]/mL (ref <14.0)

## 2023-09-03 LAB — ANTI-CCP AB, IGG + IGA (RDL): Anti-CCP Ab, IgG + IgA (RDL): <20 U (ref <20)

## 2023-09-03 LAB — SEDIMENTATION RATE: Sed Rate: 7 mm/h (ref 0–40)

## 2023-09-03 LAB — C-REACTIVE PROTEIN: CRP: 2 mg/L (ref 0–10)

## 2023-10-26 ENCOUNTER — Other Ambulatory Visit: Payer: Self-pay | Admitting: Nurse Practitioner

## 2023-10-26 DIAGNOSIS — E039 Hypothyroidism, unspecified: Secondary | ICD-10-CM

## 2023-10-26 NOTE — Telephone Encounter (Signed)
Hey.  You are listed as primary for this patient.  -HB

## 2023-11-01 ENCOUNTER — Encounter: Payer: Self-pay | Admitting: Family Medicine

## 2023-11-09 DIAGNOSIS — M109 Gout, unspecified: Secondary | ICD-10-CM | POA: Diagnosis not present

## 2023-11-09 DIAGNOSIS — E039 Hypothyroidism, unspecified: Secondary | ICD-10-CM | POA: Diagnosis not present

## 2023-11-09 DIAGNOSIS — E785 Hyperlipidemia, unspecified: Secondary | ICD-10-CM | POA: Diagnosis not present

## 2023-11-09 DIAGNOSIS — I739 Peripheral vascular disease, unspecified: Secondary | ICD-10-CM | POA: Diagnosis not present

## 2023-11-09 DIAGNOSIS — Z79890 Hormone replacement therapy (postmenopausal): Secondary | ICD-10-CM | POA: Diagnosis not present

## 2023-11-09 DIAGNOSIS — M1990 Unspecified osteoarthritis, unspecified site: Secondary | ICD-10-CM | POA: Diagnosis not present

## 2023-11-09 DIAGNOSIS — I129 Hypertensive chronic kidney disease with stage 1 through stage 4 chronic kidney disease, or unspecified chronic kidney disease: Secondary | ICD-10-CM | POA: Diagnosis not present

## 2023-11-09 DIAGNOSIS — Z720 Tobacco use: Secondary | ICD-10-CM | POA: Diagnosis not present

## 2023-11-09 DIAGNOSIS — Z809 Family history of malignant neoplasm, unspecified: Secondary | ICD-10-CM | POA: Diagnosis not present

## 2023-11-09 DIAGNOSIS — Z8249 Family history of ischemic heart disease and other diseases of the circulatory system: Secondary | ICD-10-CM | POA: Diagnosis not present

## 2023-11-09 DIAGNOSIS — Z008 Encounter for other general examination: Secondary | ICD-10-CM | POA: Diagnosis not present

## 2023-11-09 DIAGNOSIS — L309 Dermatitis, unspecified: Secondary | ICD-10-CM | POA: Diagnosis not present

## 2023-11-09 DIAGNOSIS — N1832 Chronic kidney disease, stage 3b: Secondary | ICD-10-CM | POA: Diagnosis not present

## 2024-02-08 ENCOUNTER — Other Ambulatory Visit: Payer: Self-pay | Admitting: Nurse Practitioner

## 2024-02-08 DIAGNOSIS — I10 Essential (primary) hypertension: Secondary | ICD-10-CM

## 2024-02-16 ENCOUNTER — Ambulatory Visit (INDEPENDENT_AMBULATORY_CARE_PROVIDER_SITE_OTHER): Payer: Medicare HMO | Admitting: Family Medicine

## 2024-02-16 ENCOUNTER — Encounter: Payer: Self-pay | Admitting: Family Medicine

## 2024-02-16 VITALS — BP 121/75 | HR 61 | Ht 61.0 in | Wt 142.0 lb

## 2024-02-16 DIAGNOSIS — M1A09X0 Idiopathic chronic gout, multiple sites, without tophus (tophi): Secondary | ICD-10-CM

## 2024-02-16 DIAGNOSIS — N1831 Chronic kidney disease, stage 3a: Secondary | ICD-10-CM

## 2024-02-16 DIAGNOSIS — M1A00X0 Idiopathic chronic gout, unspecified site, without tophus (tophi): Secondary | ICD-10-CM | POA: Diagnosis not present

## 2024-02-16 DIAGNOSIS — E782 Mixed hyperlipidemia: Secondary | ICD-10-CM | POA: Diagnosis not present

## 2024-02-16 DIAGNOSIS — E039 Hypothyroidism, unspecified: Secondary | ICD-10-CM | POA: Diagnosis not present

## 2024-02-16 DIAGNOSIS — I10 Essential (primary) hypertension: Secondary | ICD-10-CM

## 2024-02-16 DIAGNOSIS — Z1159 Encounter for screening for other viral diseases: Secondary | ICD-10-CM | POA: Diagnosis not present

## 2024-02-16 MED ORDER — SPIRONOLACTONE 25 MG PO TABS: 25.0000 mg | ORAL_TABLET | Freq: Every day | ORAL | 3 refills | Status: AC

## 2024-02-16 NOTE — Assessment & Plan Note (Signed)
Recheck uric acid level.

## 2024-02-16 NOTE — Assessment & Plan Note (Signed)
 Repeating kidney function and adding Cystatin C.  Has been stable.

## 2024-02-16 NOTE — Assessment & Plan Note (Signed)
 Patient concerned that lisinopril  is causing hair loss.  Blood pressure good today.  Will stop lisinopril , continue spironolactone  and have patient check blood pressures until I see her next month for her physical.  At that time we can decide if something else needs to be added back.

## 2024-02-16 NOTE — Patient Instructions (Signed)
 It was nice to see you today,  We addressed the following topics today: -I would like you to stop your lisinopril  today. - I would like you to check your blood pressure at home twice a day for the next 2 weeks and return the blood pressure log to us  - At your next visit we determine if you need a new blood pressure medicine - For your low back pain I would do the following: Take 1000 g Tylenol every 8 hours Use heating pad as needed on your low back Use topical over-the-counter treatments like Voltaren gel, IcyHot, lidocaine, etc. If none of this helps I would recommend trying an over-the-counter device called the TENS unit which provides electrical stimulation to the low back.  I can help you with showing how to use this if needed.   Have a great day,  Etha Henle, MD

## 2024-02-16 NOTE — Progress Notes (Unsigned)
   Established Patient Office Visit  Subjective   Patient ID: Kaitlin Sharp, female    DOB: Apr 27, 1946  Age: 78 y.o. MRN: [id number]  Chief Complaint  Patient presents with   Medical Management of Chronic Issues    HPI  Subjective: - Back pain: sharp stabbing pain, worse when driving - Onset: after caring for bedridden brother who died 02/09/24, required lifting - Alleviating: heating pad helps somewhat - Hair loss and nail problems - attributes to lisinopril  - Reports finding online that lisinopril  may cause hair loss - No recent gout flares - Checks BP at home occasionally  Past Medical History: - Chronic kidney disease - Hypertension - Hypothyroidism - Hyperlipidemia - Gout - stable, no recent flares - Joint pain/arthritis - previously tried Celebrex  without success - Reports lumps on hands, presumed arthritis   The 10-year ASCVD risk score (Arnett DK, et al., 2019) is: 47.5%  Health Maintenance Due  Topic Date Due   Hepatitis C Screening  Never done   DTaP/Tdap/Td (1 - Tdap) Never done   COVID-19 Vaccine (3 - Pfizer risk series) 02/27/2020   Medicare Annual Wellness (AWV)  03/02/2024      Objective:     BP 121/75   Pulse 61   Ht 5\' 1"  (1.549 m)   Wt 142 lb (64.4 kg)   SpO2 97%   BMI 26.83 kg/m  {Vitals History (Optional):23777}  Physical Exam General: Alert, oriented CV: Regular rhythm Pulmonary: Clear bilaterally    No results found for any visits on 02/16/24.      Assessment & Plan:   Essential hypertension Assessment & Plan: Patient concerned that lisinopril  is causing hair loss.  Blood pressure good today.  Will stop lisinopril , continue spironolactone  and have patient check blood pressures until I see her next month for her physical.  At that time we can decide if something else needs to be added back.  Orders: -     Spironolactone ; Take 1 tablet (25 mg total) by mouth daily.  Dispense: 90 tablet; Refill: 3  Acquired  hypothyroidism Assessment & Plan: Recheck TSH.  Continue levothyroxine    Stage 3a chronic kidney disease (HCC) Assessment & Plan: Repeating kidney function and adding Cystatin C.  Has been stable.  Orders: -     Comprehensive metabolic panel with GFR -     Cystatin C  Mixed hyperlipidemia Assessment & Plan: Continue pravastatin .  Checking cholesterol level today.  Orders: -     Lipid panel -     Hemoglobin A1c  Idiopathic chronic gout of multiple sites without tophus -     CBC with Differential/Platelet -     Uric acid  Encounter for hepatitis C screening test for low risk patient -     Hepatitis C antibody  Chronic idiopathic gout Assessment & Plan: Recheck uric acid level.      Return for Already has an appointment with me next month.    Laneta Pintos, MD

## 2024-02-16 NOTE — Assessment & Plan Note (Signed)
Recheck TSH Continue levothyroxine 

## 2024-02-16 NOTE — Progress Notes (Deleted)
 Subjective: - Back pain: sharp stabbing pain, worse when driving - Onset: after caring for bedridden brother who died 02/12/2024, required lifting - Alleviating: heating pad helps somewhat - Hair loss and nail problems - attributes to lisinopril  - Reports finding online that lisinopril  may cause hair loss - No recent gout flares - Checks BP at home occasionally  Past Medical History: - Chronic kidney disease - Hypertension - Hypothyroidism - Hyperlipidemia - Gout - stable, no recent flares - Joint pain/arthritis - previously tried Celebrex  without success - Reports lumps on hands, presumed arthritis  Objective: - Exam: tenderness in lower back, no midline tenderness - Lungs: clear to auscultation  Assessment: - Low back strain/musculoskeletal pain - Hypertension - Chronic kidney disease - Hypothyroidism - Hyperlipidemia - Gout - stable - Possible medication side effect (hair loss)  Plan: - Discontinue lisinopril , monitor BP at home twice daily for 2 weeks - Consider alternative antihypertensive at follow-up if BP elevated - Consider spironolactone  as alternative - Back pain management:   - Continue Tylenol 1000mg  q8h (two 500mg  tablets)   - Recommend topical treatments (Icy Hot, Voltaren gel)   - Continue heat therapy   - Consider TENS unit   - Advised to expect 6 weeks for improvement - Labs today: kidney function, liver function, lipids, thyroid  - Follow-up appointment in 1 month (previously scheduled for 03/17/2024) - Advised to seek emergency care for numbness, tingling, urinary/bowel incontinence, or leg weakness

## 2024-02-16 NOTE — Assessment & Plan Note (Signed)
 Continue pravastatin .  Checking cholesterol level today.

## 2024-02-17 LAB — COMPREHENSIVE METABOLIC PANEL WITH GFR
ALT: 20 IU/L (ref 0–32)
AST: 23 IU/L (ref 0–40)
Albumin: 4.4 g/dL (ref 3.8–4.8)
Alkaline Phosphatase: 104 IU/L (ref 44–121)
BUN/Creatinine Ratio: 20 (ref 12–28)
BUN: 27 mg/dL (ref 8–27)
Bilirubin Total: 0.5 mg/dL (ref 0.0–1.2)
CO2: 21 mmol/L (ref 20–29)
Calcium: 10.2 mg/dL (ref 8.7–10.3)
Chloride: 105 mmol/L (ref 96–106)
Creatinine, Ser: 1.33 mg/dL — ABNORMAL HIGH (ref 0.57–1.00)
Globulin, Total: 2.2 g/dL (ref 1.5–4.5)
Glucose: 113 mg/dL — ABNORMAL HIGH (ref 70–99)
Potassium: 5.1 mmol/L (ref 3.5–5.2)
Sodium: 141 mmol/L (ref 134–144)
Total Protein: 6.6 g/dL (ref 6.0–8.5)
eGFR: 41 mL/min/{1.73_m2} — ABNORMAL LOW (ref >59)

## 2024-02-17 LAB — CBC WITH DIFFERENTIAL/PLATELET
Basophils Absolute: 0.1 10*3/uL (ref 0.0–0.2)
Basos: 1 %
EOS (ABSOLUTE): 0.1 10*3/uL (ref 0.0–0.4)
Eos: 2 %
Hematocrit: 36.2 % (ref 34.0–46.6)
Hemoglobin: 12.4 g/dL (ref 11.1–15.9)
Immature Grans (Abs): 0 10*3/uL (ref 0.0–0.1)
Immature Granulocytes: 0 %
Lymphocytes Absolute: 2.7 10*3/uL (ref 0.7–3.1)
Lymphs: 31 %
MCH: 33.9 pg — ABNORMAL HIGH (ref 26.6–33.0)
MCHC: 34.3 g/dL (ref 31.5–35.7)
MCV: 99 fL — ABNORMAL HIGH (ref 79–97)
Monocytes Absolute: 0.7 10*3/uL (ref 0.1–0.9)
Monocytes: 8 %
Neutrophils Absolute: 5 10*3/uL (ref 1.4–7.0)
Neutrophils: 58 %
Platelets: 157 10*3/uL (ref 150–450)
RBC: 3.66 x10E6/uL — ABNORMAL LOW (ref 3.77–5.28)
RDW: 11.8 % (ref 11.7–15.4)
WBC: 8.6 10*3/uL (ref 3.4–10.8)

## 2024-02-17 LAB — LIPID PANEL
Chol/HDL Ratio: 3.4 ratio (ref 0.0–4.4)
Cholesterol, Total: 173 mg/dL (ref 100–199)
HDL: 51 mg/dL (ref >39)
LDL Chol Calc (NIH): 97 mg/dL (ref 0–99)
Triglycerides: 141 mg/dL (ref 0–149)
VLDL Cholesterol Cal: 25 mg/dL (ref 5–40)

## 2024-02-17 LAB — URIC ACID: Uric Acid: 5.6 mg/dL (ref 3.1–7.9)

## 2024-02-17 LAB — CYSTATIN C: CYSTATIN C: 1.53 mg/L — ABNORMAL HIGH (ref 0.78–1.15)

## 2024-02-17 LAB — HEPATITIS C ANTIBODY: Hep C Virus Ab: NONREACTIVE

## 2024-02-17 LAB — HEMOGLOBIN A1C
Est. average glucose Bld gHb Est-mCnc: 123 mg/dL
Hgb A1c MFr Bld: 5.9 % — ABNORMAL HIGH (ref 4.8–5.6)

## 2024-02-29 ENCOUNTER — Encounter: Payer: Self-pay | Admitting: Family Medicine

## 2024-03-10 ENCOUNTER — Ambulatory Visit: Payer: Medicare HMO

## 2024-03-10 DIAGNOSIS — Z0000 Encounter for general adult medical examination without abnormal findings: Secondary | ICD-10-CM | POA: Diagnosis not present

## 2024-03-10 NOTE — Patient Instructions (Signed)
 Kaitlin Sharp , Thank you for taking time out of your busy schedule to complete your Annual Wellness Visit with me. I enjoyed our conversation and look forward to speaking with you again next year. I, as well as your care team,  appreciate your ongoing commitment to your health goals. Please review the following plan we discussed and let me know if I can assist you in the future. Your Game plan/ To Do List    Referrals: If you haven't heard from the office you've been referred to, please reach out to them at the phone provided.  N/a Follow up Visits: Next Medicare AWV with our clinical staff: 03/23/2025 at 3:40   Have you seen your provider in the last 6 months (3 months if uncontrolled diabetes)? Yes Next Office Visit with your provider: 05/10/2024 at 4:10  Clinician Recommendations:  Aim for 30 minutes of exercise or brisk walking, 6-8 glasses of water, and 5 servings of fruits and vegetables each day.       This is a list of the screening recommended for you and due dates:  Health Maintenance  Topic Date Due   DTaP/Tdap/Td vaccine (1 - Tdap) Never done   COVID-19 Vaccine (3 - Pfizer risk series) 02/27/2020   Flu Shot  05/27/2024   Medicare Annual Wellness Visit  03/10/2025   Pneumonia Vaccine  Completed   DEXA scan (bone density measurement)  Completed   Hepatitis C Screening  Completed   Zoster (Shingles) Vaccine  Completed   HPV Vaccine  Aged Out   Meningitis B Vaccine  Aged Out   Colon Cancer Screening  Discontinued    Advanced directives: (Copy Requested) Please bring a copy of your health care power of attorney and living will to the office to be added to your chart at your convenience. You can mail to [facility] [address] [place], [place] [address] or email to [email or web address] Advance Care Planning is important because it:  [x]  Makes sure you receive the medical care that is consistent with your values, goals, and preferences  [x]  It provides  guidance to your family and loved ones and reduces their decisional burden about whether or not they are making the right decisions based on your wishes.  Follow the link provided in your after visit summary or read over the paperwork we have mailed to you to help you started getting your Advance Directives in place. If you need assistance in completing these, please reach out to us  so that we can help you!  See attachments for Preventive Care and Fall Prevention Tips.

## 2024-03-10 NOTE — Progress Notes (Signed)
 Subjective:   Kaitlin Sharp is a 78 y.o. who presents for a Medicare Wellness preventive visit.  As a reminder, Annual Wellness Visits don't include a physical exam, and some assessments may be limited, especially if this visit is performed virtually. We may recommend an in-person visit if needed.  Visit Complete: Virtual I connected with  Kaitlin Sharp on 03/10/24 by a audio enabled telemedicine application and verified that I am speaking with the correct person using two identifiers.  Patient Location: Home  Provider Location: Home Office  I discussed the limitations of evaluation and management by telemedicine. The patient expressed understanding and agreed to proceed.  Vital Signs: Because this visit was a virtual/telehealth visit, some criteria may be missing or patient reported. Any vitals not documented were not able to be obtained and vitals that have been documented are patient reported.  VideoError- Librarian, academic were attempted between this provider and patient, however failed, due to patient having technical difficulties OR patient did not have access to video capability.  We continued and completed visit with audio only.   Persons Participating in Visit: Patient.  AWV Questionnaire: No: Patient Medicare AWV questionnaire was not completed prior to this visit.  Cardiac Risk Factors include: advanced age (>42men, >59 women);dyslipidemia;hypertension     Objective:     Today's Vitals   There is no height or weight on file to calculate BMI.     03/10/2024    3:09 PM 03/03/2023    3:50 PM  Advanced Directives  Does Patient Have a Medical Advance Directive? Yes Yes  Type of Estate agent of [place];Living will Healthcare Power of [place];Living will  Copy of Healthcare Power of Attorney in Chart? No - copy requested No - copy requested    Current Medications (verified) Outpatient Encounter  Medications as of 03/10/2024  Medication Sig   allopurinol  (ZYLOPRIM ) 100 MG tablet TAKE 1 TABLET BY MOUTH EVERY DAY   levothyroxine  (SYNTHROID ) 75 MCG tablet TAKE 1 TABLET BY MOUTH EVERY DAY   pravastatin  (PRAVACHOL ) 20 MG tablet TAKE 1 TABLET BY MOUTH EVERYDAY AT BEDTIME   spironolactone  (ALDACTONE ) 25 MG tablet Take 1 tablet (25 mg total) by mouth daily.   No facility-administered encounter medications on file as of 03/10/2024.    Allergies (verified) Patient has no known allergies.   History: Past Medical History:  Diagnosis Date   Dyslipidemia    Hypertension    Kidney calculi    Past Surgical History:  Procedure Laterality Date   LITHOTRIPSY     Family History  Problem Relation Age of Onset   Brain cancer Father    Heart attack Brother    Social History   Socioeconomic History   Marital status: Single    Spouse name: Not on file   Number of children: Not on file   Years of education: Not on file   Highest education level: Not on file  Occupational History   Not on file  Tobacco Use   Smoking status: Every Day    Types: Cigarettes   Smokeless tobacco: Never  Vaping Use   Vaping status: Not on file  Substance and Sexual Activity   Alcohol use: Yes    Alcohol/week: 7.0 standard drinks of alcohol    Types: 7 Standard drinks or equivalent per week   Drug use: Never   Sexual activity: Not Currently  Other Topics Concern   Not on file  Social History Narrative   Not on  file   Social Drivers of Health   Financial Resource Strain: Low Risk  (03/10/2024)   Overall Financial Resource Strain (CARDIA)    Difficulty of Paying Living Expenses: Not hard at all  Food Insecurity: No Food Insecurity (03/10/2024)   Hunger Vital Sign    Worried About Running Out of Food in the Last Year: Never true    Ran Out of Food in the Last Year: Never true  Transportation Needs: No Transportation Needs (03/10/2024)   PRAPARE - Administrator, Civil Service (Medical): No     Lack of Transportation (Non-Medical): No  Physical Activity: Inactive (03/10/2024)   Exercise Vital Sign    Days of Exercise per Week: 0 days    Minutes of Exercise per Session: 0 min  Stress: No Stress Concern Present (03/10/2024)   [employer] of Occupational Health - Occupational Stress Questionnaire    Feeling of Stress : Not at all  Social Connections: Socially Isolated (03/10/2024)   Social Connection and Isolation Panel [NHANES]    Frequency of Communication with Friends and Family: Twice a week    Frequency of Social Gatherings with Friends and Family: Once a week    Attends Religious Services: Never    Database administrator or Organizations: No    Attends Engineer, structural: Never    Marital Status: Divorced    Tobacco Counseling Ready to quit: Yes Counseling given: Not Answered    Clinical Intake:  Pre-visit preparation completed: Yes  Pain : No/denies pain     Nutritional Risks: None Diabetes: No  Lab Results  Component Value Date   HGBA1C 5.9 (H) 02/16/2024   HGBA1C 5.7 (H) 08/11/2023   HGBA1C 5.8 (H) 02/24/2023     How often do you need to have someone help you when you read instructions, pamphlets, or other written materials from your doctor or pharmacy?: 1 - Never  Interpreter Needed?: No  Information entered by :: NAllen LPN   Activities of Daily Living     03/10/2024    3:02 PM  In your present state of health, do you have any difficulty performing the following activities:  Hearing? 1  Comment has trouble hearing but not ready for hearing aid  Vision? 1  Comment some times blurry, cataracts  Difficulty concentrating or making decisions? 0  Walking or climbing stairs? 1  Comment trouble with knees and ankles  Dressing or bathing? 0  Doing errands, shopping? 0  Preparing Food and eating ? N  Using the Toilet? N  In the past six months, have you accidently leaked urine? Y  Comment wears a pad  Do you have problems  with loss of bowel control? N  Managing your Medications? N  Managing your Finances? N  Housekeeping or managing your Housekeeping? N    Patient Care Team: Laneta Pintos, MD as PCP - General (Family Medicine)  Indicate any recent Medical Services you may have received from other than [facility] providers in the past year (date may be approximate).     Assessment:    This is a routine wellness examination for Kaitlin Sharp.  Hearing/Vision screen Hearing Screening - Comments:: Has trouble hearing, but not ready for hearing aids Vision Screening - Comments:: No regular eye exams, [facility]   Goals Addressed             This Visit's Progress    Patient Stated       03/10/2024, denies goals  Depression Screen     03/10/2024    3:12 PM 02/16/2024   11:03 AM 08/18/2023    1:57 PM 03/03/2023    3:44 PM 02/16/2023    2:19 PM 06/23/2022    9:45 AM 04/22/2022    3:29 PM  PHQ 2/9 Scores  PHQ - 2 Score 0 0 0 0 0 0 0  PHQ- 9 Score 4 2 2  0 1 4 2     Fall Risk     03/10/2024    3:12 PM 03/03/2023    3:47 PM 02/16/2023    2:18 PM 06/23/2022    9:49 AM  Fall Risk   Falls in the past year? 0 0 0 0  Number falls in past yr: 0 0 0 0  Injury with Fall? 0 0 0 0  Risk for fall due to : Medication side effect No Fall Risks No Fall Risks No Fall Risks  Follow up Falls prevention discussed;Falls evaluation completed Falls prevention discussed Falls evaluation completed Falls evaluation completed    MEDICARE RISK AT HOME:  Medicare Risk at Home Any stairs in or around the home?: Yes If so, are there any without handrails?: No Home free of loose throw rugs in walkways, pet beds, electrical cords, etc?: Yes Adequate lighting in your home to reduce risk of falls?: Yes Life alert?: No Use of a cane, walker or w/c?: No Grab bars in the bathroom?: Yes Shower chair or bench in shower?: Yes Elevated toilet seat or a handicapped toilet?: No  TIMED UP AND GO:  Was the test performed?   No  Cognitive Function: 6CIT completed        03/10/2024    3:15 PM 03/03/2023    3:50 PM 06/23/2022    9:49 AM  6CIT Screen  What Year? 0 points 0 points 0 points  What month? 0 points 0 points 0 points  What time? 0 points 0 points   Count back from 20 0 points 0 points 0 points  Months in reverse 0 points 0 points 0 points  Repeat phrase 0 points 0 points 0 points  Total Score 0 points 0 points     Immunizations Immunization History  Administered Date(s) Administered   Fluad Trivalent(High Dose 65+) 08/18/2023   Influenza, Quadrivalent, Recombinant, Inj, Pf 07/20/2021   PFIZER(Purple Top)SARS-COV-2 Vaccination 01/08/2020, 01/30/2020   PNEUMOCOCCAL CONJUGATE-20 07/20/2021   Zoster Recombinant(Shingrix) 05/22/2018, 08/16/2018    Screening Tests Health Maintenance  Topic Date Due   DTaP/Tdap/Td (1 - Tdap) Never done   COVID-19 Vaccine (3 - Pfizer risk series) 02/27/2020   INFLUENZA VACCINE  05/27/2024   Medicare Annual Wellness (AWV)  03/10/2025   Pneumonia Vaccine 64+ Years old  Completed   DEXA SCAN  Completed   Hepatitis C Screening  Completed   Zoster Vaccines- Shingrix  Completed   HPV VACCINES  Aged Out   Meningococcal B Vaccine  Aged Out   Colonoscopy  Discontinued    Health Maintenance  Health Maintenance Due  Topic Date Due   DTaP/Tdap/Td (1 - Tdap) Never done   COVID-19 Vaccine (3 - Pfizer risk series) 02/27/2020   Health Maintenance Items Addressed: Declines covid vaccine. Due TDAP.  Additional Screening:  Vision Screening: Recommended annual ophthalmology exams for early detection of glaucoma and other disorders of the eye.  Dental Screening: Recommended annual dental exams for proper oral hygiene  Community Resource Referral / Chronic Care Management: CRR required this visit?  No   CCM required this visit?  No   Plan:    I have personally reviewed and noted the following in the patient's chart:   Medical and social history Use of  alcohol, tobacco or illicit drugs  Current medications and supplements including opioid prescriptions. Patient is not currently taking opioid prescriptions. Functional ability and status Nutritional status Physical activity Advanced directives List of other physicians Hospitalizations, surgeries, and ER visits in previous 12 months Vitals Screenings to include cognitive, depression, and falls Referrals and appointments  In addition, I have reviewed and discussed with patient certain preventive protocols, quality metrics, and best practice recommendations. A written personalized care plan for preventive services as well as general preventive health recommendations were provided to patient.   Areatha Beecham, LPN   1/61/0960   After Visit Summary: (MyChart) Due to this being a telephonic visit, the after visit summary with patients personalized plan was offered to patient via MyChart   Notes: Nothing significant to report at this time.

## 2024-04-13 ENCOUNTER — Other Ambulatory Visit: Payer: Self-pay | Admitting: Family Medicine

## 2024-04-13 DIAGNOSIS — M1A09X0 Idiopathic chronic gout, multiple sites, without tophus (tophi): Secondary | ICD-10-CM

## 2024-04-14 ENCOUNTER — Other Ambulatory Visit: Payer: Self-pay | Admitting: Family Medicine

## 2024-04-14 DIAGNOSIS — E039 Hypothyroidism, unspecified: Secondary | ICD-10-CM

## 2024-05-10 ENCOUNTER — Encounter: Payer: Self-pay | Admitting: Family Medicine

## 2024-05-10 ENCOUNTER — Ambulatory Visit (INDEPENDENT_AMBULATORY_CARE_PROVIDER_SITE_OTHER): Admitting: Family Medicine

## 2024-05-10 VITALS — BP 153/84 | HR 54 | Ht 61.0 in | Wt 142.4 lb

## 2024-05-10 DIAGNOSIS — I10 Essential (primary) hypertension: Secondary | ICD-10-CM

## 2024-05-10 DIAGNOSIS — M25532 Pain in left wrist: Secondary | ICD-10-CM | POA: Diagnosis not present

## 2024-05-10 DIAGNOSIS — R1031 Right lower quadrant pain: Secondary | ICD-10-CM | POA: Diagnosis not present

## 2024-05-10 DIAGNOSIS — Z0000 Encounter for general adult medical examination without abnormal findings: Secondary | ICD-10-CM | POA: Diagnosis not present

## 2024-05-10 DIAGNOSIS — N200 Calculus of kidney: Secondary | ICD-10-CM

## 2024-05-10 MED ORDER — TAMSULOSIN HCL 0.4 MG PO CAPS: 0.4000 mg | ORAL_CAPSULE | Freq: Every day | ORAL | 0 refills | Status: DC

## 2024-05-10 MED ORDER — DICLOFENAC SODIUM 50 MG PO TBEC: 50.0000 mg | DELAYED_RELEASE_TABLET | Freq: Three times a day (TID) | ORAL | 1 refills | Status: DC | PRN

## 2024-05-10 NOTE — Patient Instructions (Signed)
 It was nice to see you today,  We addressed the following topics today: -I am ordering a x-ray of your left wrist.  You do not need a appointment for this.  You can walk-in to have it done at [address] at [facility] imaging - I am also ordering a CT scanning for stones.  I will call you to schedule this - I am prescribing you Flomax  that may help with your passing of your stones if that is what it is. - I am sending in diclofenac  tablets for you to take up to 3 times a day.  You can take your Tylenol and diclofenac  every 8 hours at the same time.  Have a great day,  Rolan Slain, MD

## 2024-05-10 NOTE — Progress Notes (Unsigned)
   Annual physical  Subjective    Patient ID: Kaitlin Sharp, female    DOB: 1946-06-14  Age: 78 y.o. MRN: [id number]  Chief Complaint  Patient presents with  . Annual Exam   HPI Kaitlin Sharp is a 78 y.o. old female here  for annual exam.   Work:*** Relationship:*** Children:*** Tobacco:*** Alcohol:*** Recreational drugs:*** Menstruation:*** Diet:*** Exercise:***  Family history of breast, ovarian, endometrial, colorectal cancer:***  Advance directive:***  Other providers:***   The patient has the following chronic health issues that are monitored on a routine basis: ***  HPI  Separate, acute concerns today: ***  The 10-year ASCVD risk score (Arnett DK, et al., 2019) is: 63.8%  Health Maintenance Due  Topic Date Due  . DTaP/Tdap/Td (1 - Tdap) Never done  . COVID-19 Vaccine (3 - Pfizer risk series) 02/27/2020      Objective:     BP (!) 151/82   Pulse (!) 54   Ht 5' 1 (1.549 m)   Wt 142 lb 6.4 oz (64.6 kg)   SpO2 98%   BMI 26.91 kg/m  {Vitals History (Optional):23777}  Physical Exam   No results found for any visits on 05/10/24.      Assessment & Plan:   There are no diagnoses linked to this encounter.   No follow-ups on file.    Toribio MARLA Slain, MD

## 2024-05-11 ENCOUNTER — Encounter: Payer: Self-pay | Admitting: Family Medicine

## 2024-05-11 DIAGNOSIS — N200 Calculus of kidney: Secondary | ICD-10-CM | POA: Insufficient documentation

## 2024-05-11 DIAGNOSIS — M25532 Pain in left wrist: Secondary | ICD-10-CM | POA: Insufficient documentation

## 2024-05-11 NOTE — Assessment & Plan Note (Signed)
 History of recurrent kidney stones, including one requiring lithotripsy. Reports symptoms consistent with passing a stone last week, with persistent right-sided flank pain and back pain. Unsure if the stone has passed. No dysuria reported. - CT scan of the abdomen/pelvis for renal calculi will be ordered. Staff will call to schedule. - Prescribing Flomax  to aid in passing the stone. - Counseled on taking diclofenac  for pain.

## 2024-05-11 NOTE — Assessment & Plan Note (Signed)
:   Worsening pain and swelling in both hands, left greater than right, concerning for gouty tophi versus other etiology. Inflammatory arthritis workup in October was negative. Pain limits function. - X-ray of the left wrist to be done at [facility] on [address] (walk-in). - Prescribing diclofenac , an NSAID, to be taken two to three times daily for no more than two consecutive weeks for pain. - Counseled that diclofenac  can be taken with Tylenol every eight hours. - If X-ray is negative, will consider ultrasound or referral to orthopedics.

## 2024-05-11 NOTE — Assessment & Plan Note (Signed)
 Home blood pressure log reviewed and shows good control on average since discontinuing lisinopril . Very few of her home readings are > 140 systolic.  Average is in the 130s.  Increased bp today likely 2/2 pain from possible kidney stones - Continue current management.

## 2024-05-16 ENCOUNTER — Ambulatory Visit
Admission: RE | Admit: 2024-05-16 | Discharge: 2024-05-16 | Disposition: A | Discharge status: Home / Self Care | Source: Ambulatory Visit | Attending: Family Medicine

## 2024-05-16 ENCOUNTER — Ambulatory Visit
Admission: RE | Admit: 2024-05-16 | Discharge: 2024-05-16 | Disposition: A | Discharge status: Home / Self Care | Source: Ambulatory Visit | Attending: Family Medicine | Admitting: Family Medicine

## 2024-05-16 DIAGNOSIS — M11232 Other chondrocalcinosis, left wrist: Secondary | ICD-10-CM | POA: Diagnosis not present

## 2024-05-16 DIAGNOSIS — M7989 Other specified soft tissue disorders: Secondary | ICD-10-CM | POA: Diagnosis not present

## 2024-05-16 DIAGNOSIS — K5730 Diverticulosis of large intestine without perforation or abscess without bleeding: Secondary | ICD-10-CM | POA: Diagnosis not present

## 2024-05-16 DIAGNOSIS — K8020 Calculus of gallbladder without cholecystitis without obstruction: Secondary | ICD-10-CM | POA: Diagnosis not present

## 2024-05-16 DIAGNOSIS — J439 Emphysema, unspecified: Secondary | ICD-10-CM | POA: Diagnosis not present

## 2024-05-16 DIAGNOSIS — R1031 Right lower quadrant pain: Secondary | ICD-10-CM

## 2024-05-16 DIAGNOSIS — M25532 Pain in left wrist: Secondary | ICD-10-CM

## 2024-05-21 ENCOUNTER — Ambulatory Visit: Payer: Self-pay | Admitting: Family Medicine

## 2024-05-21 DIAGNOSIS — R1031 Right lower quadrant pain: Secondary | ICD-10-CM

## 2024-05-24 ENCOUNTER — Ambulatory Visit
Admission: RE | Admit: 2024-05-24 | Discharge: 2024-05-24 | Disposition: A | Discharge status: Home / Self Care | Source: Ambulatory Visit | Attending: Family Medicine | Admitting: Family Medicine

## 2024-05-24 DIAGNOSIS — N838 Other noninflammatory disorders of ovary, fallopian tube and broad ligament: Secondary | ICD-10-CM | POA: Diagnosis not present

## 2024-05-24 DIAGNOSIS — R1031 Right lower quadrant pain: Secondary | ICD-10-CM | POA: Diagnosis not present

## 2024-06-01 ENCOUNTER — Ambulatory Visit: Payer: Self-pay | Admitting: Family Medicine

## 2024-06-01 ENCOUNTER — Other Ambulatory Visit: Payer: Self-pay | Admitting: Family Medicine

## 2024-06-01 DIAGNOSIS — N9489 Other specified conditions associated with female genital organs and menstrual cycle: Secondary | ICD-10-CM

## 2024-06-02 ENCOUNTER — Other Ambulatory Visit: Payer: Self-pay | Admitting: Family Medicine

## 2024-07-07 ENCOUNTER — Encounter: Payer: Self-pay | Admitting: Family Medicine

## 2024-07-24 ENCOUNTER — Other Ambulatory Visit: Payer: Self-pay | Admitting: Family Medicine

## 2024-07-24 DIAGNOSIS — E782 Mixed hyperlipidemia: Secondary | ICD-10-CM

## 2024-08-01 ENCOUNTER — Encounter: Payer: Self-pay | Admitting: Obstetrics and Gynecology

## 2024-08-01 ENCOUNTER — Ambulatory Visit: Admitting: Obstetrics and Gynecology

## 2024-08-01 VITALS — BP 148/79 | HR 54 | Ht 61.0 in | Wt 141.0 lb

## 2024-08-01 DIAGNOSIS — R1900 Intra-abdominal and pelvic swelling, mass and lump, unspecified site: Secondary | ICD-10-CM

## 2024-08-01 NOTE — Progress Notes (Signed)
 78 y.o. New GYN Referral, presents for Consultation of enlarged right Ovary.

## 2024-08-01 NOTE — Progress Notes (Signed)
 78 yo P1 postmenopausal woth BMI 26 here for the evaluation of an enlarged pelvic mass of ? Ovarian origin. Patient reports mostly bilateral lower back pain since July 2025. The pain is intermittent and similar to her kidney stone pain. Patient was unaware of pelvic findings on her CT scan and ultrasound. Patient has been menopausal for over 30 years and denies any episodes of vaginal bleeding. She denies pelvic pain or abnormal discharge. She denies urinary incontinence. She reports occasional rectal leakage based on her diet. She is without any other complaints  Past Medical History:  Diagnosis Date   Anemia    Dyslipidemia    Hypertension    Hypothyroidism    Kidney calculi    Past Surgical History:  Procedure Laterality Date   LITHOTRIPSY     TUBAL LIGATION     Family History  Problem Relation Age of Onset   Cancer Father    Brain cancer Father    Heart attack Brother    Cancer Paternal Uncle    Cancer Paternal Grandmother    Cancer Paternal Grandfather    Social History   Tobacco Use   Smoking status: Every Day    Types: Cigarettes   Smokeless tobacco: Never  Vaping Use   Vaping status: Never Used  Substance Use Topics   Alcohol use: Yes    Alcohol/week: 7.0 standard drinks of alcohol    Types: 7 Standard drinks or equivalent per week    Comment: rarely   Drug use: Never   ROS See pertinent in HPI. All other systems reviewed and non contributory Blood pressure (!) 148/79, pulse (!) 54, height 5' 1 (1.549 m), weight 141 lb (64 kg). GENERAL: Well-developed, well-nourished female in no acute distress.  NEURO: alert and oriented x 3 EXTREMITIES: No cyanosis, clubbing, or edema, 2+ distal pulses.  US  PELVIC COMPLETE WITH TRANSVAGINAL Result Date: 06/01/2024 CLINICAL DATA:  Right abdominal pain, enlarged right ovary EXAM: TRANSABDOMINAL AND TRANSVAGINAL ULTRASOUND OF PELVIS TECHNIQUE: Both transabdominal and transvaginal ultrasound examinations of the pelvis were  performed. Transabdominal technique was performed for global imaging of the pelvis including uterus, ovaries, adnexal regions, and pelvic cul-de-sac. It was necessary to proceed with endovaginal exam following the transabdominal exam to visualize the uterus endometrium adnexa. COMPARISON:  CT 05/16/2024 FINDINGS: Uterus Measurements: 6.5 x 2.8 x 4.5 cm = volume: 42.9 mL. Large solid mass within the left adnexa is, abutting the lower uterine segment, this measures 4.8 x 4.8 x 4.9 cm. Endometrium Thickness: 3.3 mm.  No focal abnormality visualized. Right ovary Measurements: 4.1 x 3.9 x 2.9 cm = volume: 23.8 mL. Heterogenous echotexture with calcifications. Left ovary Not seen Other findings No abnormal free fluid. IMPRESSION: 1. Slightly enlarged heterogeneous appearance of the right ovary, atypical given patient age and suspect possible solid right ovarian lesion. Gynecology follow-up recommended. 2. Large solid 4.9 cm left adnexal mass, abuts the left lower uterine segment, without discretely visualized separate left ovary. Findings could represent large pedunculated adnexal fibroid but solid adnexal/ovarian mass is also in the differential. Again gynecology follow-up recommended with potential evaluation with MRI. Electronically Signed   By: Luke Bun M.D.   On: 06/01/2024 01:36   DG Wrist Complete Left Result Date: 05/21/2024 CLINICAL DATA:  Painful nodules on left wrist. EXAM: LEFT WRIST - COMPLETE 3+ VIEW COMPARISON:  None Available. FINDINGS: There is no evidence of fracture or dislocation. Mild radiocarpal joint space narrowing and spurring of the radial styloid. 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  There is chondrocalcinosis of the triangular fibrocartilage. Additional soft tissue calcifications are seen about the proximal carpal row. There is mild soft tissue thickening about the ulnar aspect of the wrist. IMPRESSION: 1. Mild soft tissue thickening about the ulnar aspect of the wrist.  2. Chondrocalcinosis of the triangular fibrocartilage. Additional soft tissue calcifications about the proximal carpal row. 3. 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 Electronically Signed   By: Andrea Gasman M.D.   On: 05/21/2024 11:26   CT RENAL STONE STUDY Result Date: 05/21/2024 CLINICAL DATA:  Abdominal/flank pain.  Right lower quadrant pain. EXAM: CT ABDOMEN AND PELVIS WITHOUT CONTRAST TECHNIQUE: Multidetector CT imaging of the abdomen and pelvis was performed following the standard protocol without IV contrast. RADIATION DOSE REDUCTION: This exam was performed according to the departmental dose-optimization program which includes automated exposure control, adjustment of the mA and/or kV according to patient size and/or use of iterative reconstruction technique. COMPARISON:  None Available. FINDINGS: Lower chest: Emphysema. No basilar airspace disease or pleural effusion Hepatobiliary: No evidence of focal liver abnormality on this unenhanced exam. Question of micro lobulated liver contours. Calcified gallstone. No abnormal gallbladder distension. No pericholecystic inflammation. Pancreas: No ductal dilatation or inflammation. Spleen: Normal in size without focal abnormality. Adrenals/Urinary Tract: No adrenal nodule. No hydronephrosis. No renal calculi. Mild symmetric perinephric stranding, typically chronic. Tiny hypodensity in the upper right kidney is too small to characterize. No further follow-up imaging is recommended. Decompressed ureters. The urinary bladder is completely empty and not assessed. Stomach/Bowel: Detailed bowel assessment is limited in the absence of enteric contrast. Minimal hiatal hernia. The stomach is nondistended. Duodenal diverticulum. No small bowel distension or evidence of obstruction. Normal appendix visualized. Moderate volume of colonic stool. Multifocal colonic diverticulosis. No diverticulitis. Vascular/Lymphatic: Aortic atherosclerosis  and tortuosity. No aortic aneurysm. There is no obvious bulky adenopathy on this unenhanced exam. Reproductive: The uterus and adnexal structures are difficult to define on this unenhanced exam. Uterus may be deviated into the left pelvis, however left adnexal mass is difficult to exclude. Questionable prominent right ovary for age at 2.9 cm, series 2, image 53. Other: Trace stranding in the deep pelvis without significant free fluid. No abdominal ascites. Fat in both inguinal canals. Musculoskeletal: Moderate scoliosis and degenerative change in the spine. There are no acute or suspicious osseous abnormalities. IMPRESSION: 1. No renal stones or obstructive uropathy. 2. The uterus and adnexal structures are difficult to define on this unenhanced exam. Uterus may be deviated into the left pelvis, however left adnexal mass is difficult to exclude. Questionable prominent right ovary for age at 2.9 cm. Recommend pelvic ultrasound for further evaluation. 3. Colonic diverticulosis without diverticulitis. 4. Cholelithiasis without cholecystitis. 5. Question of micro lobulated liver contours, can be seen with cirrhosis. Recommend correlation with liver function tests and cirrhosis risk factors. 6. Pulmonary emphysema. Aortic Atherosclerosis (ICD10-I70.0).  Emphysema (ICD10-J43.9). Electronically Signed   By: Andrea Gasman M.D.   On: 05/21/2024 11:25     A/P 78 yo postmenopausal with pelvic mass - Will obtain pelvic MRI before to determine if it is a fibroid vs an ovarian mass - Patient will be contacted with results - Patient is current with colonoscopy performed last yeat

## 2024-08-09 ENCOUNTER — Ambulatory Visit (HOSPITAL_COMMUNITY)
Admission: RE | Admit: 2024-08-09 | Discharge: 2024-08-09 | Disposition: A | Discharge status: Home / Self Care | Source: Ambulatory Visit | Attending: Obstetrics and Gynecology | Admitting: Obstetrics and Gynecology

## 2024-08-09 DIAGNOSIS — R1900 Intra-abdominal and pelvic swelling, mass and lump, unspecified site: Secondary | ICD-10-CM | POA: Diagnosis not present

## 2024-08-09 DIAGNOSIS — K5730 Diverticulosis of large intestine without perforation or abscess without bleeding: Secondary | ICD-10-CM | POA: Diagnosis not present

## 2024-08-09 MED ORDER — GADOBUTROL 1 MMOL/ML IV SOLN
6.0000 mL | Freq: Once | INTRAVENOUS | Status: AC | PRN
Start: 2024-08-09 — End: 2024-08-09
  Administered 2024-08-09: 6 mL via INTRAVENOUS

## 2024-08-10 ENCOUNTER — Ambulatory Visit (INDEPENDENT_AMBULATORY_CARE_PROVIDER_SITE_OTHER): Admitting: Family Medicine

## 2024-08-10 ENCOUNTER — Encounter: Payer: Self-pay | Admitting: Family Medicine

## 2024-08-10 VITALS — BP 150/73 | HR 58 | Ht 61.0 in | Wt 142.0 lb

## 2024-08-10 DIAGNOSIS — N9489 Other specified conditions associated with female genital organs and menstrual cycle: Secondary | ICD-10-CM | POA: Diagnosis not present

## 2024-08-10 DIAGNOSIS — G8929 Other chronic pain: Secondary | ICD-10-CM | POA: Diagnosis not present

## 2024-08-10 DIAGNOSIS — Z23 Encounter for immunization: Secondary | ICD-10-CM | POA: Diagnosis not present

## 2024-08-10 DIAGNOSIS — M545 Low back pain, unspecified: Secondary | ICD-10-CM

## 2024-08-10 MED ORDER — OXYCODONE HCL 5 MG PO TABS: 2.5000 mg | ORAL_TABLET | ORAL | 0 refills | Status: DC | PRN

## 2024-08-10 MED ORDER — MELOXICAM 15 MG PO TABS: 15.0000 mg | ORAL_TABLET | Freq: Every day | ORAL | 0 refills | Status: DC

## 2024-08-10 NOTE — Progress Notes (Signed)
 Established Patient Office Visit  Subjective   Patient ID: Kaitlin Sharp, female    DOB: 1945/11/20  Age: 78 y.o. MRN: [id number]  Chief Complaint  Patient presents with   Medical Management of Chronic Issues    HPI  Subjective - Follow-up on recent pelvic MRI results. Patient reports ongoing bilateral pelvic pain and back pain. The pain is described as cramp-like. Reports occasional radiation of pain down the legs and some unsteadiness with walking. Left hip also causes issues at times. The pain is rated as a 5-6/10, sometimes higher with a headache.  Medications: Currently taking Tylenol (acetaminophen) 650 mg, two tablets four times a day, which provides some but incomplete relief. Previously tried diclofenac , but discontinued it as they did not like it. Also previously on Flomax  for suspected kidney stone, which was discontinued.  PMH, PSH, FH, Social Hx: No known drug allergies.  ROS: Constitutional: Denies fever, chills. GU: No urinary bladder or urethral abnormalities noted on imaging. GI: Known diverticulosis. Musculoskeletal: Reports back pain and occasional left hip pain. Neurological: Reports occasional unsteady gait. Denies consistent pain radiation down legs.  The 10-year ASCVD risk score (Arnett DK, et al., 2019) is: 66.8%  Health Maintenance Due  Topic Date Due   DTaP/Tdap/Td (1 - Tdap) Never done   COVID-19 Vaccine (3 - Pfizer risk series) 02/27/2020      Objective:     BP (!) 150/73   Pulse (!) 58   Ht 5' 1 (1.549 m)   Wt 142 lb (64.4 kg)   SpO2 99%   BMI 26.83 kg/m    Physical Exam Gen: alert, oriented Pulm: no respiratory distress MSK: Tenderness to palpation noted over the flank area. No midline spinal tenderness. Pain does not radiate down the legs on exam. Psych: pleasant affect   No results found for any visits on 08/10/24.      Assessment & Plan:   Adnexal mass Assessment & Plan:  Bilateral adnexal masses, likely  fibromas or fibroids Recent pelvic MRI showed bilateral T2 hypo-intense solid masses where the ovaries are expected. The right mass measures 2.7 x 2 cm and the left measures 5.7 x 5 cm. The radiologist's differential diagnosis includes bilateral ovarian fibromas or pedunculated uterine fibroids. These findings are the likely etiology of the patient's bilateral pelvic and back pain. Personally reviewed the mri w/ the pt and showed her the masses in question.  Results came back in the past few days and pt has not had the opportunity to discuss them with the gynecologist yet.  - Awaiting follow-up with gynecology (Dr. Alger at [facility]) to discuss MRI results and further management, which may include surgical removal.   Immunization due -     Flu vaccine HIGH DOSE PF(Fluzone Trivalent)  Chronic bilateral low back pain without sciatica Assessment & Plan: Pain is partially controlled with high-dose Tylenol. Exam is consistent with pain originating from pelvic structures rather than the spine. - Prescribed meloxicam 15 mg once daily for 2 weeks. May take with Tylenol. - Prescribed oxycodone 5 mg, 10 tablets. Instructed to use sparingly for severe breakthrough pain only, starting with half a tablet (2.5 mg). - Continue using a heating pad as needed for symptomatic relief. - Follow up here in 2 months to reassess.   Other orders -     Meloxicam; Take 1 tablet (15 mg total) by mouth daily.  Dispense: 30 tablet; Refill: 0 -     oxyCODONE HCl; Take 0.5 tablets (  2.5 mg total) by mouth every 4 (four) hours as needed for severe pain (pain score 7-10) or breakthrough pain.  Dispense: 10 tablet; Refill: 0     Return in about 2 months (around 10/10/2024) for HTN, back pain.    Toribio MARLA Slain, MD

## 2024-08-10 NOTE — Patient Instructions (Signed)
 It was nice to see you today,  We addressed the following topics today: - I have counseled you on the use of Tylenol. Please do not take more than 1,000 mg at one time, and no more than 4,000 mg in a 24-hour period to avoid liver damage. This means you should take either three of the 325 mg tablets or two of the 500 mg tablets at a time, every 6-8 hours. - Take the meloxicam once a day. You can take it in the morning or at night, whichever works best for your pain schedule. It is fine to take with your blood pressure medications. - Use the oxycodone only if you have severe pain that is not controlled by the Tylenol and meloxicam. I would like you to start with half a pill. Please use this medication sparingly to avoid dependence. - If you do not hear from the gynecologist's office by next week, please call them to schedule an appointment to discuss your MRI results. The office is [facility] at [place] on [address].  Have a great day,  Rolan Slain, MD

## 2024-08-11 ENCOUNTER — Ambulatory Visit: Payer: Self-pay | Admitting: Obstetrics and Gynecology

## 2024-08-11 DIAGNOSIS — N9489 Other specified conditions associated with female genital organs and menstrual cycle: Secondary | ICD-10-CM

## 2024-08-14 DIAGNOSIS — N9489 Other specified conditions associated with female genital organs and menstrual cycle: Secondary | ICD-10-CM | POA: Insufficient documentation

## 2024-08-14 DIAGNOSIS — M549 Dorsalgia, unspecified: Secondary | ICD-10-CM | POA: Insufficient documentation

## 2024-08-14 NOTE — Assessment & Plan Note (Signed)
 Bilateral adnexal masses, likely fibromas or fibroids Recent pelvic MRI showed bilateral T2 hypo-intense solid masses where the ovaries are expected. The right mass measures 2.7 x 2 cm and the left measures 5.7 x 5 cm. The radiologist's differential diagnosis includes bilateral ovarian fibromas or pedunculated uterine fibroids. These findings are the likely etiology of the patient's bilateral pelvic and back pain. Personally reviewed the mri w/ the pt and showed her the masses in question.  Results came back in the past few days and pt has not had the opportunity to discuss them with the gynecologist yet.  - Awaiting follow-up with gynecology (Dr. Alger at [facility] at [facility]) to discuss MRI results and further management, which may include surgical removal.

## 2024-08-14 NOTE — Assessment & Plan Note (Signed)
 Pain is partially controlled with high-dose Tylenol. Exam is consistent with pain originating from pelvic structures rather than the spine. - Prescribed meloxicam 15 mg once daily for 2 weeks. May take with Tylenol. - Prescribed oxycodone 5 mg, 10 tablets. Instructed to use sparingly for severe breakthrough pain only, starting with half a tablet (2.5 mg). - Continue using a heating pad as needed for symptomatic relief. - Follow up here in 2 months to reassess.

## 2024-08-19 ENCOUNTER — Other Ambulatory Visit

## 2024-08-19 DIAGNOSIS — N9489 Other specified conditions associated with female genital organs and menstrual cycle: Secondary | ICD-10-CM

## 2024-08-22 LAB — PREMENOPAUSAL INTERP: HIGH

## 2024-08-22 LAB — OVARIAN MALIGNANCY RISK-ROMA
Cancer Antigen (CA) 125: 29.9 U/mL (ref 0.0–38.1)
HE4: 182 pmol/L — ABNORMAL HIGH (ref 0.0–96.9)
Postmenopausal ROMA: 4.52 — ABNORMAL HIGH
Premenopausal ROMA: 6.45 — ABNORMAL HIGH

## 2024-08-22 LAB — POSTMENOPAUSAL INTERP: HIGH

## 2024-08-23 ENCOUNTER — Telehealth: Payer: Self-pay | Admitting: *Deleted

## 2024-08-23 ENCOUNTER — Ambulatory Visit: Payer: Self-pay | Admitting: Obstetrics and Gynecology

## 2024-08-23 DIAGNOSIS — N9489 Other specified conditions associated with female genital organs and menstrual cycle: Secondary | ICD-10-CM

## 2024-08-23 NOTE — Telephone Encounter (Signed)
 Spoke with the patient regarding the referral to GYN oncology. Patient scheduled as new patient with  Dr Viktoria on 11/7 at 10:30 am. Patient given an arrival time of 10 am.  Explained to the patient the the doctor will perform a pelvic exam at this visit. Patient given the policy that only one visitor allowed and that visitor must be over 16 yrs are allowed in the Cancer Center. Patient given the address/phone number for the clinic and that the center offers free valet service. Patient aware that masks optional.

## 2024-08-31 ENCOUNTER — Encounter: Payer: Self-pay | Admitting: Gynecologic Oncology

## 2024-09-01 NOTE — Progress Notes (Unsigned)
 GYNECOLOGIC ONCOLOGY NEW PATIENT CONSULTATION   Patient Name: Kaitlin Sharp  Patient Age: 78 y.o. Date of Service: 09/02/24 Referring Provider: Winton Felt, MD  Primary Care Provider: Chandra Toribio POUR, MD Consulting Provider: Comer Dollar, MD   Assessment/Plan:  Postmenopausal patient with bilateral solid-appearing adnexal masses.  Reviewed recent imaging and workup to date.  Looked at MRI pictures together.  She has bilateral solid-appearing adnexal masses that are difficult to distinguish as adnexal versus pedunculated fibroids.  In looking at the imaging, I suspect that they are bilateral adnexal masses.  On MRI, there are new features that would raise significant concern for malignancy.  There is also not evidence of metastatic disease.  We reviewed recent tumor markers which included a normal CA125 and elevated HE4.  While HE4 may be elevated due to pelvic pathology, discussed that she has multiple other disease processes that can increase this tumor marker including chronic kidney disease, hypertension, inflammatory polyarthritis and tobacco use.  In terms of treatment options, discussed possibility of close imaging surveillance to ensure no change to size or character of these masses or surgical excision.  Reviewed that surgical excision would hopefully help alleviate some of not all of the symptoms she has been having and help us  rule out borderline process or malignancy.  Ultimately, the patient would like to move forward with scheduling surgery.  Her preference is to do the least amount possible.  We discussed that if these masses are adnexal, plan would be for bilateral salpingo-oophorectomy.  If masses are pedunculated fibroids, then we will plan on total hysterectomy.  One or both masses will be sent to pathology for frozen section.  In the event of frozen section confirming borderline tumor or malignancy, we discussed staging procedures including peritoneal biopsies,  omentectomy, and possible lymphadenectomy.  Discussed tobacco use and increased morbidity in the perioperative period.  Encouragement given for decreasing tobacco use or quitting.  We reviewed the plan for a robotic assisted bilateral salpingo-oophorectomy, possible total hysterectomy, possible staging, possible laparotomy. The risks of surgery were discussed in detail and she understands these to include infection; wound separation; hernia; vaginal cuff separation, injury to adjacent organs such as bowel, bladder, blood vessels, ureters and nerves; bleeding which may require blood transfusion; anesthesia risk; thromboembolic events; possible death; unforeseen complications; possible need for re-exploration; medical complications such as heart attack, stroke, pleural effusion and pneumonia; and, if full lymphadenectomy is performed the risk of lymphedema and lymphocyst. The patient will receive DVT and antibiotic prophylaxis as indicated. She voiced a clear understanding. She had the opportunity to ask questions. Perioperative instructions were reviewed with her. Prescriptions for post-op medications were sent to her pharmacy of choice.  My office will reach out to her PCP for surgical clearance.  A copy of this note was sent to the patient's referring provider.   70 minutes of total time was spent for this patient encounter, including preparation, face-to-face counseling with the patient and coordination of care, and documentation of the encounter.  Comer Dollar, MD  Division of Gynecologic Oncology  Department of Obstetrics and Gynecology  [facility]  ___________________________________________  Chief Complaint: Chief Complaint  Patient presents with   Adnexal mass    History of Present Illness:  Kaitlin Sharp is a 78 y.o. y.o. female who is seen in consultation at the request of Dr. Winton Felt for an evaluation of bilateral adnexal masses.  CT  renal stone study done for RLQ and flank pain on 7/21.  This showed difficult to define pelvic organs with prominent right ovary for age at 2.9 cm. Diverticulosis without diverticulitis noted. Follow-up pelvic ultrasound on 05/24/24 showed slightly enlarge heterogenous appearance of right ovary, suspect possible solid right ovarian lesion. 4.9 solid left adnexal mass, abuts the left LUS, could represented pedunculated fibroid vs ovarian adnexal mass. MRI on 10/14 showed bilateral solid T2 hypointense adnexal masses, measuring 2.7 cm and 5.7 cm on right and left respectively. Differential includes ovarian fibrothecomas and pedunculated uterine fibroids. Normal appearance of the uterus.   Tumor markers showed CA-125 of 29.9, HE4 of 182, and postmenopausal ROMA of 4.52.   Today, patient reports overall doing well.  She reports about a year of mid bilateral back pain that she attributed to kidney stones as it is in the same location when she has had kidney stones previously.  More recently, she developed some abdominal bloating.  She describes this as feeling pressure like her tummy is going to fall.  She endorses a good appetite without nausea or emesis.  Thinks that she has gained a few pounds in the last 6 months related to eating more junk food.  Struggles with constipation at baseline, uses milk of magnesia as needed.  Has some intermittent stress urinary incontinence, wears a pad.  Denies any recent changes to urinary symptoms.  PAST MEDICAL HISTORY:  Past Medical History:  Diagnosis Date   Anemia    Dyslipidemia    Hypertension    Hypothyroidism    Kidney calculi      PAST SURGICAL HISTORY:  Past Surgical History:  Procedure Laterality Date   DILATION AND CURETTAGE OF UTERUS     LITHOTRIPSY     TUBAL LIGATION      OB/GYN HISTORY:  OB History  Gravida Para Term Preterm AB Living  2    1 1   SAB IAB Ectopic Multiple Live Births      1    # Outcome Date GA Lbr Len/2nd Weight Sex Type  Anes PTL Lv  2 Gravida 12/19/62    F Vag-Spont   LIV  1 AB             No LMP recorded. Patient is postmenopausal.  Age at menarche: 37  Age at menopause: 88s Hx of HRT: yes Hx of STDs: denies Last pap: unsure History of abnormal pap smears: denies  SCREENING STUDIES:  Last mammogram: 2016  Last colonoscopy: 2022  MEDICATIONS: Outpatient Encounter Medications as of 09/02/2024  Medication Sig   allopurinol  (ZYLOPRIM ) 100 MG tablet TAKE 1 TABLET BY MOUTH EVERY DAY   levothyroxine  (SYNTHROID ) 75 MCG tablet TAKE 1 TABLET BY MOUTH EVERY DAY   pravastatin  (PRAVACHOL ) 20 MG tablet TAKE 1 TABLET BY MOUTH EVERYDAY AT BEDTIME   spironolactone  (ALDACTONE ) 25 MG tablet Take 1 tablet (25 mg total) by mouth daily.   [DISCONTINUED] meloxicam (MOBIC) 15 MG tablet Take 1 tablet (15 mg total) by mouth daily.   oxyCODONE (OXY IR/ROXICODONE) 5 MG immediate release tablet Take 0.5 tablets (2.5 mg total) by mouth every 4 (four) hours as needed for severe pain (pain score 7-10) or breakthrough pain. (Patient not taking: Reported on 08/31/2024)   No facility-administered encounter medications on file as of 09/02/2024.    ALLERGIES:  No Known Allergies   FAMILY HISTORY:  Family History  Problem Relation Age of Onset   Brain cancer Father    Heart attack Brother    Stomach cancer Paternal Grandmother    Kidney cancer Paternal Uncle  Cancer Paternal Uncle      SOCIAL HISTORY:  Social Connections: Socially Isolated (08/06/2024)   Social Connection and Isolation Panel    Frequency of Communication with Friends and Family: Once a week    Frequency of Social Gatherings with Friends and Family: Never    Attends Religious Services: Never    Database Administrator or Organizations: No    Attends Engineer, Structural: Not on file    Marital Status: Divorced    REVIEW OF SYSTEMS:  + Bloating, abdominal pain, constipation, back pain Denies appetite changes, fevers, chills, fatigue,  unexplained weight changes. Denies hearing loss, neck lumps or masses, mouth sores, ringing in ears or voice changes. Denies cough or wheezing.  Denies shortness of breath. Denies chest pain or palpitations. Denies leg swelling. Denies blood in stools, diarrhea, nausea, vomiting, or early satiety. Denies pain with intercourse, dysuria, frequency, hematuria or incontinence. Denies hot flashes, pelvic pain, vaginal bleeding or vaginal discharge.   Denies joint pain or muscle pain/cramps. Denies itching, rash, or wounds. Denies dizziness, headaches, numbness or seizures. Denies swollen lymph nodes or glands, denies easy bruising or bleeding. Denies anxiety, depression, confusion, or decreased concentration.  Physical Exam:  Vital Signs for this encounter:  Blood pressure (!) 142/87, pulse 60, temperature 98.6 F (37 C), temperature source Oral, resp. rate 18, height 5' 1 (1.549 m), weight 144 lb 3.2 oz (65.4 kg), SpO2 98%. Body mass index is 27.25 kg/m. General: Alert, oriented, no acute distress.  HEENT: Normocephalic, atraumatic. Sclera anicteric.  Chest: Clear to auscultation bilaterally.  Some expiratory wheezing, no rhonchi, or rales. Cardiovascular: Regular rate and rhythm, no murmurs, rubs, or gallops.  Abdomen: Normoactive bowel sounds. Soft, nondistended, nontender to palpation. No masses or hepatosplenomegaly appreciated. No palpable fluid wave.  Extremities: Grossly normal range of motion. Warm, well perfused. No edema bilaterally.  Skin: No rashes or lesions.  Lymphatics: No cervical, supraclavicular, or inguinal adenopathy.  GU:  Normal external female genitalia. No lesions. No discharge or bleeding.             Bladder/urethra:  No lesions or masses, well supported bladder             Vagina: Moderately atrophic, no lesions.             Cervix: Normal appearing, no lesions.             Uterus: Exam very poorly tolerated by the patient.  Uterus somewhat difficult to  appreciate but there is some fullness in the cul-de-sac.              Adnexa: Again difficult to appreciate due to poor toleration of exam, see above.  Rectal: Deferred.  LABORATORY AND RADIOLOGIC DATA:  Outside medical records were reviewed to synthesize the above history, along with the history and physical obtained during the visit.   Lab Results  Component Value Date   WBC 8.6 02/16/2024   HGB 12.4 02/16/2024   HCT 36.2 02/16/2024   PLT 157 02/16/2024   GLUCOSE 113 (H) 02/16/2024   CHOL 173 02/16/2024   TRIG 141 02/16/2024   HDL 51 02/16/2024   LDLCALC 97 02/16/2024   ALT 20 02/16/2024   AST 23 02/16/2024   NA 141 02/16/2024   K 5.1 02/16/2024   CL 105 02/16/2024   CREATININE 1.33 (H) 02/16/2024   BUN 27 02/16/2024   CO2 21 02/16/2024   TSH 2.570 08/11/2023   HGBA1C 5.9 (H) 02/16/2024

## 2024-09-01 NOTE — H&P (View-Only) (Signed)
 GYNECOLOGIC ONCOLOGY NEW PATIENT CONSULTATION   Patient Name: Kaitlin Sharp  Patient Age: 78 y.o. Date of Service: 09/02/24 Referring Provider: Winton Felt, MD  Primary Care Provider: Chandra Toribio POUR, MD Consulting Provider: Comer Dollar, MD   Assessment/Plan:  Postmenopausal patient with bilateral solid-appearing adnexal masses.  Reviewed recent imaging and workup to date.  Looked at MRI pictures together.  She has bilateral solid-appearing adnexal masses that are difficult to distinguish as adnexal versus pedunculated fibroids.  In looking at the imaging, I suspect that they are bilateral adnexal masses.  On MRI, there are new features that would raise significant concern for malignancy.  There is also not evidence of metastatic disease.  We reviewed recent tumor markers which included a normal CA125 and elevated HE4.  While HE4 may be elevated due to pelvic pathology, discussed that she has multiple other disease processes that can increase this tumor marker including chronic kidney disease, hypertension, inflammatory polyarthritis and tobacco use.  In terms of treatment options, discussed possibility of close imaging surveillance to ensure no change to size or character of these masses or surgical excision.  Reviewed that surgical excision would hopefully help alleviate some of not all of the symptoms she has been having and help us  rule out borderline process or malignancy.  Ultimately, the patient would like to move forward with scheduling surgery.  Her preference is to do the least amount possible.  We discussed that if these masses are adnexal, plan would be for bilateral salpingo-oophorectomy.  If masses are pedunculated fibroids, then we will plan on total hysterectomy.  One or both masses will be sent to pathology for frozen section.  In the event of frozen section confirming borderline tumor or malignancy, we discussed staging procedures including peritoneal biopsies,  omentectomy, and possible lymphadenectomy.  Discussed tobacco use and increased morbidity in the perioperative period.  Encouragement given for decreasing tobacco use or quitting.  We reviewed the plan for a robotic assisted bilateral salpingo-oophorectomy, possible total hysterectomy, possible staging, possible laparotomy. The risks of surgery were discussed in detail and she understands these to include infection; wound separation; hernia; vaginal cuff separation, injury to adjacent organs such as bowel, bladder, blood vessels, ureters and nerves; bleeding which may require blood transfusion; anesthesia risk; thromboembolic events; possible death; unforeseen complications; possible need for re-exploration; medical complications such as heart attack, stroke, pleural effusion and pneumonia; and, if full lymphadenectomy is performed the risk of lymphedema and lymphocyst. The patient will receive DVT and antibiotic prophylaxis as indicated. She voiced a clear understanding. She had the opportunity to ask questions. Perioperative instructions were reviewed with her. Prescriptions for post-op medications were sent to her pharmacy of choice.  My office will reach out to her PCP for surgical clearance.  A copy of this note was sent to the patient's referring provider.   70 minutes of total time was spent for this patient encounter, including preparation, face-to-face counseling with the patient and coordination of care, and documentation of the encounter.  Comer Dollar, MD  Division of Gynecologic Oncology  Department of Obstetrics and Gynecology  [facility]  ___________________________________________  Chief Complaint: Chief Complaint  Patient presents with   Adnexal mass    History of Present Illness:  Kaitlin Sharp is a 78 y.o. y.o. female who is seen in consultation at the request of Dr. Winton Felt for an evaluation of bilateral adnexal masses.  CT  renal stone study done for RLQ and flank pain on 7/21.  This showed difficult to define pelvic organs with prominent right ovary for age at 2.9 cm. Diverticulosis without diverticulitis noted. Follow-up pelvic ultrasound on 05/24/24 showed slightly enlarge heterogenous appearance of right ovary, suspect possible solid right ovarian lesion. 4.9 solid left adnexal mass, abuts the left LUS, could represented pedunculated fibroid vs ovarian adnexal mass. MRI on 10/14 showed bilateral solid T2 hypointense adnexal masses, measuring 2.7 cm and 5.7 cm on right and left respectively. Differential includes ovarian fibrothecomas and pedunculated uterine fibroids. Normal appearance of the uterus.   Tumor markers showed CA-125 of 29.9, HE4 of 182, and postmenopausal ROMA of 4.52.   Today, patient reports overall doing well.  She reports about a year of mid bilateral back pain that she attributed to kidney stones as it is in the same location when she has had kidney stones previously.  More recently, she developed some abdominal bloating.  She describes this as feeling pressure like her tummy is going to fall.  She endorses a good appetite without nausea or emesis.  Thinks that she has gained a few pounds in the last 6 months related to eating more junk food.  Struggles with constipation at baseline, uses milk of magnesia as needed.  Has some intermittent stress urinary incontinence, wears a pad.  Denies any recent changes to urinary symptoms.  PAST MEDICAL HISTORY:  Past Medical History:  Diagnosis Date   Anemia    Dyslipidemia    Hypertension    Hypothyroidism    Kidney calculi      PAST SURGICAL HISTORY:  Past Surgical History:  Procedure Laterality Date   DILATION AND CURETTAGE OF UTERUS     LITHOTRIPSY     TUBAL LIGATION      OB/GYN HISTORY:  OB History  Gravida Para Term Preterm AB Living  2    1 1   SAB IAB Ectopic Multiple Live Births      1    # Outcome Date GA Lbr Len/2nd Weight Sex Type  Anes PTL Lv  2 Gravida 12/19/62    F Vag-Spont   LIV  1 AB             No LMP recorded. Patient is postmenopausal.  Age at menarche: 37  Age at menopause: 88s Hx of HRT: yes Hx of STDs: denies Last pap: unsure History of abnormal pap smears: denies  SCREENING STUDIES:  Last mammogram: 2016  Last colonoscopy: 2022  MEDICATIONS: Outpatient Encounter Medications as of 09/02/2024  Medication Sig   allopurinol  (ZYLOPRIM ) 100 MG tablet TAKE 1 TABLET BY MOUTH EVERY DAY   levothyroxine  (SYNTHROID ) 75 MCG tablet TAKE 1 TABLET BY MOUTH EVERY DAY   pravastatin  (PRAVACHOL ) 20 MG tablet TAKE 1 TABLET BY MOUTH EVERYDAY AT BEDTIME   spironolactone  (ALDACTONE ) 25 MG tablet Take 1 tablet (25 mg total) by mouth daily.   [DISCONTINUED] meloxicam (MOBIC) 15 MG tablet Take 1 tablet (15 mg total) by mouth daily.   oxyCODONE (OXY IR/ROXICODONE) 5 MG immediate release tablet Take 0.5 tablets (2.5 mg total) by mouth every 4 (four) hours as needed for severe pain (pain score 7-10) or breakthrough pain. (Patient not taking: Reported on 08/31/2024)   No facility-administered encounter medications on file as of 09/02/2024.    ALLERGIES:  No Known Allergies   FAMILY HISTORY:  Family History  Problem Relation Age of Onset   Brain cancer Father    Heart attack Brother    Stomach cancer Paternal Grandmother    Kidney cancer Paternal Uncle  Cancer Paternal Uncle      SOCIAL HISTORY:  Social Connections: Socially Isolated (08/06/2024)   Social Connection and Isolation Panel    Frequency of Communication with Friends and Family: Once a week    Frequency of Social Gatherings with Friends and Family: Never    Attends Religious Services: Never    Database Administrator or Organizations: No    Attends Engineer, Structural: Not on file    Marital Status: Divorced    REVIEW OF SYSTEMS:  + Bloating, abdominal pain, constipation, back pain Denies appetite changes, fevers, chills, fatigue,  unexplained weight changes. Denies hearing loss, neck lumps or masses, mouth sores, ringing in ears or voice changes. Denies cough or wheezing.  Denies shortness of breath. Denies chest pain or palpitations. Denies leg swelling. Denies blood in stools, diarrhea, nausea, vomiting, or early satiety. Denies pain with intercourse, dysuria, frequency, hematuria or incontinence. Denies hot flashes, pelvic pain, vaginal bleeding or vaginal discharge.   Denies joint pain or muscle pain/cramps. Denies itching, rash, or wounds. Denies dizziness, headaches, numbness or seizures. Denies swollen lymph nodes or glands, denies easy bruising or bleeding. Denies anxiety, depression, confusion, or decreased concentration.  Physical Exam:  Vital Signs for this encounter:  Blood pressure (!) 142/87, pulse 60, temperature 98.6 F (37 C), temperature source Oral, resp. rate 18, height 5' 1 (1.549 m), weight 144 lb 3.2 oz (65.4 kg), SpO2 98%. Body mass index is 27.25 kg/m. General: Alert, oriented, no acute distress.  HEENT: Normocephalic, atraumatic. Sclera anicteric.  Chest: Clear to auscultation bilaterally.  Some expiratory wheezing, no rhonchi, or rales. Cardiovascular: Regular rate and rhythm, no murmurs, rubs, or gallops.  Abdomen: Normoactive bowel sounds. Soft, nondistended, nontender to palpation. No masses or hepatosplenomegaly appreciated. No palpable fluid wave.  Extremities: Grossly normal range of motion. Warm, well perfused. No edema bilaterally.  Skin: No rashes or lesions.  Lymphatics: No cervical, supraclavicular, or inguinal adenopathy.  GU:  Normal external female genitalia. No lesions. No discharge or bleeding.             Bladder/urethra:  No lesions or masses, well supported bladder             Vagina: Moderately atrophic, no lesions.             Cervix: Normal appearing, no lesions.             Uterus: Exam very poorly tolerated by the patient.  Uterus somewhat difficult to  appreciate but there is some fullness in the cul-de-sac.              Adnexa: Again difficult to appreciate due to poor toleration of exam, see above.  Rectal: Deferred.  LABORATORY AND RADIOLOGIC DATA:  Outside medical records were reviewed to synthesize the above history, along with the history and physical obtained during the visit.   Lab Results  Component Value Date   WBC 8.6 02/16/2024   HGB 12.4 02/16/2024   HCT 36.2 02/16/2024   PLT 157 02/16/2024   GLUCOSE 113 (H) 02/16/2024   CHOL 173 02/16/2024   TRIG 141 02/16/2024   HDL 51 02/16/2024   LDLCALC 97 02/16/2024   ALT 20 02/16/2024   AST 23 02/16/2024   NA 141 02/16/2024   K 5.1 02/16/2024   CL 105 02/16/2024   CREATININE 1.33 (H) 02/16/2024   BUN 27 02/16/2024   CO2 21 02/16/2024   TSH 2.570 08/11/2023   HGBA1C 5.9 (H) 02/16/2024

## 2024-09-02 ENCOUNTER — Inpatient Hospital Stay: Attending: Gynecologic Oncology | Admitting: Gynecologic Oncology

## 2024-09-02 ENCOUNTER — Encounter: Payer: Self-pay | Admitting: Gynecologic Oncology

## 2024-09-02 ENCOUNTER — Telehealth: Payer: Self-pay | Admitting: *Deleted

## 2024-09-02 ENCOUNTER — Inpatient Hospital Stay (HOSPITAL_BASED_OUTPATIENT_CLINIC_OR_DEPARTMENT_OTHER): Admitting: Gynecologic Oncology

## 2024-09-02 VITALS — BP 148/68 | HR 60 | Temp 98.6°F | Resp 18 | Ht 61.0 in | Wt 144.2 lb

## 2024-09-02 DIAGNOSIS — Z800 Family history of malignant neoplasm of digestive organs: Secondary | ICD-10-CM | POA: Diagnosis not present

## 2024-09-02 DIAGNOSIS — Z808 Family history of malignant neoplasm of other organs or systems: Secondary | ICD-10-CM | POA: Insufficient documentation

## 2024-09-02 DIAGNOSIS — E039 Hypothyroidism, unspecified: Secondary | ICD-10-CM | POA: Insufficient documentation

## 2024-09-02 DIAGNOSIS — R140 Abdominal distension (gaseous): Secondary | ICD-10-CM

## 2024-09-02 DIAGNOSIS — Z809 Family history of malignant neoplasm, unspecified: Secondary | ICD-10-CM | POA: Diagnosis not present

## 2024-09-02 DIAGNOSIS — N1831 Chronic kidney disease, stage 3a: Secondary | ICD-10-CM

## 2024-09-02 DIAGNOSIS — I10 Essential (primary) hypertension: Secondary | ICD-10-CM | POA: Insufficient documentation

## 2024-09-02 DIAGNOSIS — Z8051 Family history of malignant neoplasm of kidney: Secondary | ICD-10-CM | POA: Diagnosis not present

## 2024-09-02 DIAGNOSIS — N9489 Other specified conditions associated with female genital organs and menstrual cycle: Secondary | ICD-10-CM

## 2024-09-02 DIAGNOSIS — E785 Hyperlipidemia, unspecified: Secondary | ICD-10-CM | POA: Insufficient documentation

## 2024-09-02 DIAGNOSIS — F17210 Nicotine dependence, cigarettes, uncomplicated: Secondary | ICD-10-CM | POA: Insufficient documentation

## 2024-09-02 DIAGNOSIS — N393 Stress incontinence (female) (male): Secondary | ICD-10-CM | POA: Insufficient documentation

## 2024-09-02 DIAGNOSIS — K5900 Constipation, unspecified: Secondary | ICD-10-CM | POA: Diagnosis not present

## 2024-09-02 DIAGNOSIS — Z79899 Other long term (current) drug therapy: Secondary | ICD-10-CM | POA: Diagnosis not present

## 2024-09-02 DIAGNOSIS — R978 Other abnormal tumor markers: Secondary | ICD-10-CM | POA: Diagnosis not present

## 2024-09-02 DIAGNOSIS — R1900 Intra-abdominal and pelvic swelling, mass and lump, unspecified site: Secondary | ICD-10-CM | POA: Diagnosis not present

## 2024-09-02 DIAGNOSIS — Z79890 Hormone replacement therapy (postmenopausal): Secondary | ICD-10-CM | POA: Diagnosis not present

## 2024-09-02 DIAGNOSIS — Z87442 Personal history of urinary calculi: Secondary | ICD-10-CM | POA: Diagnosis not present

## 2024-09-02 DIAGNOSIS — Z720 Tobacco use: Secondary | ICD-10-CM

## 2024-09-02 MED ORDER — SENNOSIDES-DOCUSATE SODIUM 8.6-50 MG PO TABS: 2.0000 | ORAL_TABLET | Freq: Every day | ORAL | 0 refills | Status: AC

## 2024-09-02 NOTE — Patient Instructions (Signed)
 Preparing for your Surgery  Plan for surgery on September 28, 2024 with Dr. Comer Dollar at [facility]. You will be scheduled for robotic assisted laparoscopic bilateral salpingo-oophorectomy (removal of both ovaries and fallopian tubes), possible robotic assisted total laparoscopic hysterectomy (removal of the uterus and cervix), possible staging if a precancer or cancer is found, possible laparotomy (larger incision on your abdomen if needed).  Pre-operative Testing -You will receive a phone call from presurgical testing at [facility] to arrange for a pre-operative appointment and lab work.  -Bring your insurance card, copy of an advanced directive if applicable, medication list  -At that visit, you will be asked to sign a consent for a possible blood transfusion in case a transfusion becomes necessary during surgery.  The need for a blood transfusion is rare but having consent is a necessary part of your care.     -You should not be taking blood thinners or aspirin at least ten days prior to surgery unless instructed by your surgeon.  -Do not take supplements such as fish oil (omega 3), red yeast rice, turmeric before your surgery. STOP TAKING AT LEAST 10 DAYS BEFORE SURGERY. You want to avoid medications with aspirin in them including headache powders such as BC or Goody's), Excedrin migraine.  -If you are taking a GLP-1 medication/injection such as Ozempic, Mounjaro, [id number], this needs to be held before surgery for at least 7 days before.  Day Before Surgery at Home -You will be asked to take in a light diet the day before surgery. You will be advised you can have clear liquids up until 3 hours before your surgery.    Eat a light diet the day before surgery.  Examples including soups, broths, toast, yogurt, mashed potatoes.  AVOID GAS PRODUCING FOODS AND BEVERAGES. Things to avoid include carbonated beverages (fizzy beverages, sodas), raw fruits and raw vegetables  (uncooked), or beans.   If your bowels are filled with gas, your surgeon will have difficulty visualizing your pelvic organs which increases your surgical risks.  Your role in recovery Your role is to become active as soon as directed by your doctor, while still giving yourself time to heal.  Rest when you feel tired. You will be asked to do the following in order to speed your recovery:  - Cough and breathe deeply. This helps to clear and expand your lungs and can prevent pneumonia after surgery.  - STAY ACTIVE WHEN YOU GET HOME. Do mild physical activity. Walking or moving your legs help your circulation and body functions return to normal. Do not try to get up or walk alone the first time after surgery.   -If you develop swelling on one leg or the other, pain in the back of your leg, redness/warmth in one of your legs, please call the office or go to the Emergency Room to have a doppler to rule out a blood clot. For shortness of breath, chest pain-seek care in the Emergency Room as soon as possible. - Actively manage your pain. Managing your pain lets you move in comfort. We will ask you to rate your pain on a scale of zero to 10. It is your responsibility to tell your doctor or nurse where and how much you hurt so your pain can be treated.  Special Considerations -If you are diabetic, you may be placed on insulin after surgery to have closer control over your blood sugars to promote healing and recovery.  This does not mean that you  will be discharged on insulin.  If applicable, your oral antidiabetics will be resumed when you are tolerating a solid diet.  -Your final pathology results from surgery should be available around one week after surgery and the results will be relayed to you when available.  -FMLA forms can be faxed to [phone] and please allow 5-7 business days for completion.  Pain Management After Surgery -You will be prescribed your pain medication and bowel regimen  medications before surgery so that you can have these available when you are discharged from the hospital. The pain medication is for use ONLY AFTER surgery and a new prescription will not be given.   -Make sure that you have Tylenol IF YOU ARE ABLE TO TAKE THESE MEDICATION at home to use on a regular basis after surgery for pain control.   -Review the attached handout on narcotic use and their risks and side effects.   Bowel Regimen -You will be prescribed Sennakot-S to take nightly to prevent constipation especially if you are taking the narcotic pain medication intermittently.  It is important to prevent constipation and drink adequate amounts of liquids. You can stop taking this medication when you are not taking pain medication and you are back on your normal bowel routine.  Risks of Surgery Risks of surgery are low but include bleeding, infection, damage to surrounding structures, re-operation, blood clots, and very rarely death.   Blood Transfusion Information (For the consent to be signed before surgery)  We will be checking your blood type before surgery so in case of emergencies, we will know what type of blood you would need.                                            WHAT IS A BLOOD TRANSFUSION?  A transfusion is the replacement of blood or some of its parts. Blood is made up of multiple cells which provide different functions. Red blood cells carry oxygen and are used for blood loss replacement. White blood cells fight against infection. Platelets control bleeding. Plasma helps clot blood. Other blood products are available for specialized needs, such as hemophilia or other clotting disorders. BEFORE THE TRANSFUSION  Who gives blood for transfusions?  You may be able to donate blood to be used at a later date on yourself (autologous donation). Relatives can be asked to donate blood. This is generally not any safer than if you have received blood from a stranger. The same  precautions are taken to ensure safety when a relative's blood is donated. Healthy volunteers who are fully evaluated to make sure their blood is safe. This is blood bank blood. Transfusion therapy is the safest it has ever been in the practice of medicine. Before blood is taken from a donor, a complete history is taken to make sure that person has no history of diseases nor engages in risky social behavior (examples are intravenous drug use or sexual activity with multiple partners). The donor's travel history is screened to minimize risk of transmitting infections, such as malaria. The donated blood is tested for signs of infectious diseases, such as HIV and hepatitis. The blood is then tested to be sure it is compatible with you in order to minimize the chance of a transfusion reaction. If you or a relative donates blood, this is often done in anticipation of surgery and is not appropriate for emergency  situations. It takes many days to process the donated blood. RISKS AND COMPLICATIONS Although transfusion therapy is very safe and saves many lives, the main dangers of transfusion include:  Getting an infectious disease. Developing a transfusion reaction. This is an allergic reaction to something in the blood you were given. Every precaution is taken to prevent this. The decision to have a blood transfusion has been considered carefully by your caregiver before blood is given. Blood is not given unless the benefits outweigh the risks.  AFTER SURGERY INSTRUCTIONS  Return to work: 4-6 weeks if applicable  Activity: 1. Be up and out of the bed during the day.  Take a nap if needed.  You may walk up steps but be careful and use the hand rail.  Stair climbing will tire you more than you think, you may need to stop part way and rest.   2. No lifting or straining for 6 weeks over 10 pounds. No pushing, pulling, straining for 6 weeks.  3. No driving for 4-89 days when the following criteria have been  met: Do not drive if you are taking narcotic pain medicine and make sure that your reaction time has returned.   4. You can shower as soon as the next day after surgery. Shower daily.  Use your regular soap and water (not directly on the incision) and pat your incision(s) dry afterwards; don't rub.  No tub baths or submerging your body in water until cleared by your surgeon. If you have the soap that was given to you by pre-surgical testing that was used before surgery, you do not need to use it afterwards because this can irritate your incisions.   5. No sexual activity and nothing in the vagina for 6 weeks, 12 weeks if you have a hysterectomy (removal of the uterus and cervix).  6. You may experience a small amount of clear drainage from your incisions, which is normal.  If the drainage persists, increases, or changes color please call the office.  7. Do not use creams, lotions, or ointments such as neosporin on your incisions after surgery until advised by your surgeon because they can cause removal of the dermabond glue on your incisions.    8. You may experience vaginal spotting after surgery or when the stitches at the top of the vagina begin to dissolve.  The spotting is normal but if you experience heavy bleeding, call our office.  9. Take Tylenol first for pain if you are able to take these medication and only use narcotic pain medication for severe pain not relieved by the Tylenol.  Monitor your Tylenol intake to a max of 4,000 mg in a 24 hour period.  Diet: 1. Low sodium Heart Healthy Diet is recommended but you are cleared to resume your normal (before surgery) diet after your procedure.  2. It is safe to use a laxative, such as Miralax or Colace, if you have difficulty moving your bowels before surgery. You have been prescribed Sennakot-S to take at bedtime every evening after surgery to keep bowel movements regular and to prevent constipation.    Wound Care: 1. Keep clean and dry.   Shower daily.  Reasons to call the Doctor: Fever - Oral temperature greater than 100.4 degrees Fahrenheit Foul-smelling vaginal discharge Difficulty urinating Nausea and vomiting Increased pain at the site of the incision that is unrelieved with pain medicine. Difficulty breathing with or without chest pain New calf pain especially if only on one side Sudden, continuing increased  vaginal bleeding with or without clots.   Contacts: For questions or concerns you should contact:  Dr. Comer Dollar at [phone]  Eleanor Epps, NP at [phone]  After Hours: call [phone] and have the GYN Oncologist paged/contacted (after 5 pm or on the weekends). You will speak with an after hours RN and let he or she know you have had surgery.  Messages sent via mychart are for non-urgent matters and are not responded to after hours so for urgent needs, please call the after hours number.

## 2024-09-02 NOTE — Telephone Encounter (Signed)
 Per Dr Viktoria fax surgical optimization form to the patient's PCP office (Dr Chandra [phone])

## 2024-09-02 NOTE — Progress Notes (Signed)
 Patient here for new patient consultation with Dr. Viktoria and for a pre-operative appointment prior to her scheduled surgery on 09/28/2024. She is scheduled for robotic assisted laparoscopic bilateral salpingo-oophorectomy (removal of both ovaries and fallopian tubes), possible robotic assisted total laparoscopic hysterectomy (removal of the uterus and cervix), possible staging if a precancer or cancer is found, possible laparotomy. The surgery was discussed in detail.  See after visit summary for additional details.    Discussed post-op pain management in detail including the aspects of the enhanced recovery pathway.  She has oxycodone from her last procedure that she has not used. She will let us  know if she needs a refill preoperatively. We discussed the use of tylenol post-op and to monitor for a maximum of 4,000 mg in a 24 hour period.  Also prescribed sennakot to be used after surgery and to hold if having loose stools.  Discussed bowel regimen in detail.     Discussed measures to take at home to prevent DVT including frequent mobility.  Reportable signs and symptoms of DVT discussed. Post-operative instructions discussed and expectations for after surgery. Incisional care discussed as well including reportable signs and symptoms including erythema, drainage, wound separation.     10 minutes spent with the patient.  Verbalizing understanding of material discussed. No needs or concerns voiced at the end of the visit.   Advised patient to call for any needs.   This appointment is included in the global surgical bundle as pre-operative teaching and has no charge.

## 2024-09-05 ENCOUNTER — Telehealth: Payer: Self-pay

## 2024-09-05 NOTE — Telephone Encounter (Signed)
 Surgical clearance came in the fax from gynecology oncology. Placed in provider box

## 2024-09-13 ENCOUNTER — Encounter: Admitting: Obstetrics and Gynecology

## 2024-09-14 NOTE — Telephone Encounter (Signed)
 Spoke with Donna at the PCP office, they are working on it

## 2024-09-14 NOTE — Telephone Encounter (Signed)
 Copied from CRM [id number]. Topic: General - Other >> Sep 14, 2024 11:46 AM Donna BRAVO wrote: Reason for CRM: Rosaline (Other) [phone] GYN Oncology Fax back to [phone]  Wanting an update on the Surgical clearance form that is in the providers box, per note on 09/05/24

## 2024-09-15 NOTE — Telephone Encounter (Signed)
 Correct contact phone number for Rosaline is [phone]

## 2024-09-15 NOTE — Telephone Encounter (Signed)
 Clearance has been Faxed and confirmation received.

## 2024-09-15 NOTE — Telephone Encounter (Signed)
 Received PCP clearance.

## 2024-09-16 ENCOUNTER — Telehealth: Payer: Self-pay

## 2024-09-16 NOTE — Telephone Encounter (Signed)
 Copied from CRM #[id number]. Topic: General - Other >> Sep 16, 2024 10:29 AM Kaitlin Sharp wrote: Reason for CRM: Patient is calling to inform Dr. Chandra that she is scheduled to have her tubes and ovaries removed on 12/3 at [facility]. The patient can be contacted at [phone]

## 2024-09-22 NOTE — Patient Instructions (Signed)
 SURGICAL WAITING ROOM VISITATION Patients having surgery or a procedure may have no more than 2 support people in the waiting area - these visitors may rotate in the visitor waiting room.   If the patient needs to stay at the hospital during part of their recovery, the visitor guidelines for inpatient rooms apply.  PRE-OP VISITATION  Pre-op nurse will coordinate an appropriate time for 1 support person to accompany the patient in pre-op.  This support person may not rotate.  This visitor will be contacted when the time is appropriate for the visitor to come back in the pre-op area.  Please refer to the [facility] website for the visitor guidelines for Inpatients (after your surgery is over and you are in a regular room).  You are not required to quarantine at this time prior to your surgery. However, you must do this: Hand Hygiene often Do NOT share personal items Notify your provider if you are in close contact with someone who has COVID or you develop fever 100.4 or greater, new onset of sneezing, cough, sore throat, shortness of breath or body aches.  If you test positive for Covid or have been in contact with anyone that has tested positive in the last 10 days please notify you surgeon.    Your procedure is scheduled on:  Wednesday  September 28, 2024  Report to [facility] Main Entrance: [address] entrance where the [employer] is available.   Report to admitting at: 09:45 AM  Call this number if you have any questions or problems the morning of surgery [phone]  FOLLOW ANY ADDITIONAL PRE OP INSTRUCTIONS YOU RECEIVED FROM YOUR SURGEON'S OFFICE!!!  Do not eat food after Midnight the night prior to your surgery/procedure.  After Midnight you may have the following liquids until  09:00  AM DAY OF SURGERY  Clear Liquid Diet Water Black Coffee (sugar ok, NO MILK/CREAM OR CREAMERS)  Tea (sugar ok, NO MILK/CREAM OR CREAMERS) regular and decaf                              Plain Jell-O  with no fruit (NO RED)                                           Fruit ices (not with fruit pulp, NO RED)                                     Popsicles (NO RED)                                                                  Juice: NO CITRUS JUICES: only apple, WHITE grape, WHITE cranberry Sports drinks like Gatorade or Powerade (NO RED)              Nothing else to drink after 09:00: No candy, chewing gum or throat lozenges.   Oral Hygiene is also important to reduce your risk of infection.        Remember - BRUSH YOUR TEETH THE  MORNING OF SURGERY WITH YOUR REGULAR TOOTHPASTE  Do NOT smoke after Midnight the night before surgery.  STOP TAKING all Vitamins, Herbs and supplements 1 week before your surgery.   Take ONLY these medicines the morning of surgery with A SIP OF WATER:  Levothyroxine , allopurinol ,   DO NOT TAKE Spironolactone  the morning of your surgery                   You may not have any metal on your body including hair pins, jewelry, and body piercing  Do not wear make-up, lotions, powders, perfumes or deodorant  Do not wear nail polish including gel and S&S, artificial / acrylic nails, or any other type of covering on natural nails including finger and toenails. If you have artificial nails, gel coating, etc., that needs to be removed by a nail salon, Please have this removed prior to surgery. Not doing so may mean that your surgery could be cancelled or delayed if the Surgeon or anesthesia staff feels like they are unable to monitor you safely.   Do not shave 48 hours prior to surgery to avoid nicks in your skin which may contribute to postoperative infections.   Contacts, Hearing Aids, dentures or bridgework may not be worn into surgery. DENTURES WILL BE REMOVED PRIOR TO SURGERY PLEASE DO NOT APPLY Poly grip OR ADHESIVES!!!  You may bring a small overnight bag with you on the day of surgery, only pack items that are not valuable. [facility] IS NOT  RESPONSIBLE   FOR VALUABLES THAT ARE LOST OR STOLEN.   Patients discharged on the day of surgery will not be allowed to drive home.  Someone NEEDS to stay with you for the first 24 hours after anesthesia.  Do not bring your home medications to the hospital. The Pharmacy will dispense medications listed on your medication list to you during your admission in the Hospital.  Special Instructions: Bring a copy of your healthcare power of attorney and living will documents the day of surgery, if you wish to have them scanned into your [facility]- EPIC  Please read over the following fact sheets you were given: IF YOU HAVE QUESTIONS ABOUT YOUR PRE-OP INSTRUCTIONS, PLEASE CALL [phone]   [facility] - Preparing for Surgery        Before surgery, you can play an important role.  Because skin is not sterile, your skin needs to be as free of germs as possible.  You can reduce the number of germs on your skin by washing with CHG (chlorahexidine gluconate) soap before surgery.  CHG is an antiseptic cleaner which kills germs and bonds with the skin to continue killing germs even after washing. Please DO NOT use if you have an allergy to CHG or antibacterial soaps.  If your skin becomes reddened/irritated stop using the CHG and inform your nurse when you arrive at Short Stay. Do not shave (including legs and underarms) for at least 48 hours prior to the first CHG shower.  You may shave your face/neck.  Please follow these instructions carefully:  1.  Shower with CHG Soap the night before surgery ONLY (DO NOT USE THE CHG SOAP THE MORNING OF SURGERY).  2.  If you choose to wash your hair, wash your hair first as usual with your normal  shampoo.  3.  After you shampoo, rinse your hair and body thoroughly to remove the shampoo.  4.  Use CHG as you would any other liquid soap.  You can apply chg directly to the skin and wash.  Gently with a scrungie or clean  washcloth.  5.  Apply the CHG Soap to your body ONLY FROM THE NECK DOWN.   Do not use on face/ open                           Wound or open sores. Avoid contact with eyes, ears mouth and genitals (private parts).                       Wash face,  Genitals (private parts) with your normal soap.             6.  Wash thoroughly, paying special attention to the area where your  surgery  will be performed.  7.  Thoroughly rinse your body with warm water from the neck down.  8.  DO NOT shower/wash with your normal soap after using and rinsing off the CHG Soap.                9.  Pat yourself dry with a clean towel.            10.  Wear clean pajamas.            11.  Place clean sheets on your bed the night of your first shower and do not  sleep with pets.  Day of Surgery : Do not apply any CHG, lotions/deodorants the morning of surgery.  Please wear clean clothes to the hospital/surgery center.   FAILURE TO FOLLOW THESE INSTRUCTIONS MAY RESULT IN THE CANCELLATION OF YOUR SURGERY  PATIENT SIGNATURE_________________________________  NURSE SIGNATURE__________________________________  ________________________________________________________________________

## 2024-09-22 NOTE — Progress Notes (Signed)
 COVID Vaccine received:  []  No [x]  Yes Date of any COVID positive Test in last 90 days:  none  PCP - Toribio Slain, MD clearance scanned in Media Cardiologist - none  Chest x-ray - 2017  2v  CEW EKG - 2022  Epic   pt says she had at PCP will call Stress Test -  ECHO -  Cardiac Cath -  CT Coronary Calcium score:   Pacemaker / ICD device [x]  No []  Yes   Spinal Cord Stimulator:[x]  No []  Yes       History of Sleep Apnea? []  No [x]  Yes   CPAP used?- [x]  No []  Yes    Medication on DOS: Levothyroxine , allopurinol ,  Hold DOS: Spironolactone   Patient has: []  NO Hx DM   [x]  Pre-DM   []  DM1  []   DM2 Does the patient monitor blood sugar?   []  N/A   [x]  No []  Yes  Last A1c was: 5.9 on 02-16-24        Blood Thinner / Instructions:  none Aspirin Instructions:  none  Activity level: Able to walk up 2 flights of stairs without becoming significantly short of breath or having chest pain?  [x]  No would have SOB  []    Yes  Patient can perform ADLs without assistance. []  No   [x]   Yes  Anesthesia review: HTN, Gout (polyarthritis), CKD3a, anemia, smoker, pre-DM - no meds  Patient denies any S&S of respiratory illness or Covid - no shortness of breath, fever, cough or chest pain at PAT appointment.  Patient verbalized understanding and agreement to the Pre-Surgical Instructions that were given to them at this PAT appointment. Patient was also educated of the need to review these PAT instructions again prior to his/her surgery.I reviewed the appropriate phone numbers to call if they have any and questions or concerns.

## 2024-09-23 ENCOUNTER — Other Ambulatory Visit: Payer: Self-pay

## 2024-09-23 ENCOUNTER — Encounter (HOSPITAL_COMMUNITY): Payer: Self-pay

## 2024-09-23 ENCOUNTER — Encounter (HOSPITAL_COMMUNITY)
Admission: RE | Admit: 2024-09-23 | Discharge: 2024-09-23 | Disposition: A | Discharge status: Home / Self Care | Source: Ambulatory Visit | Attending: Gynecologic Oncology | Admitting: Gynecologic Oncology

## 2024-09-23 VITALS — BP 147/70 | HR 52 | Temp 98.0°F | Resp 16 | Ht 61.0 in | Wt 141.0 lb

## 2024-09-23 DIAGNOSIS — Z01812 Encounter for preprocedural laboratory examination: Secondary | ICD-10-CM | POA: Diagnosis not present

## 2024-09-23 DIAGNOSIS — N9489 Other specified conditions associated with female genital organs and menstrual cycle: Secondary | ICD-10-CM | POA: Diagnosis not present

## 2024-09-23 DIAGNOSIS — Z01818 Encounter for other preprocedural examination: Secondary | ICD-10-CM | POA: Diagnosis not present

## 2024-09-23 DIAGNOSIS — I10 Essential (primary) hypertension: Secondary | ICD-10-CM | POA: Diagnosis not present

## 2024-09-23 DIAGNOSIS — R7303 Prediabetes: Secondary | ICD-10-CM

## 2024-09-23 HISTORY — DX: Gastro-esophageal reflux disease without esophagitis: K21.9

## 2024-09-23 HISTORY — DX: Peripheral vascular disease, unspecified: I73.9

## 2024-09-23 HISTORY — DX: Unspecified osteoarthritis, unspecified site: M19.90

## 2024-09-23 HISTORY — DX: Personal history of urinary calculi: Z87.442

## 2024-09-23 HISTORY — DX: Prediabetes: R73.03

## 2024-09-23 HISTORY — DX: Cardiac arrhythmia, unspecified: I49.9

## 2024-09-23 LAB — COMPREHENSIVE METABOLIC PANEL WITH GFR
ALT: 16 U/L (ref 0–44)
AST: 24 U/L (ref 15–41)
Albumin: 4.3 g/dL (ref 3.5–5.0)
Alkaline Phosphatase: 111 U/L (ref 38–126)
Anion gap: 10 (ref 5–15)
BUN: 15 mg/dL (ref 8–23)
CO2: 26 mmol/L (ref 22–32)
Calcium: 10.8 mg/dL — ABNORMAL HIGH (ref 8.9–10.3)
Chloride: 102 mmol/L (ref 98–111)
Creatinine, Ser: 1.12 mg/dL — ABNORMAL HIGH (ref 0.44–1.00)
GFR, Estimated: 50 mL/min — ABNORMAL LOW (ref >60)
Glucose, Bld: 108 mg/dL — ABNORMAL HIGH (ref 70–99)
Potassium: 4.9 mmol/L (ref 3.5–5.1)
Sodium: 138 mmol/L (ref 135–145)
Total Bilirubin: 0.6 mg/dL (ref 0.0–1.2)
Total Protein: 7.1 g/dL (ref 6.5–8.1)

## 2024-09-23 LAB — CBC
HCT: 40.7 % (ref 36.0–46.0)
Hemoglobin: 13.3 g/dL (ref 12.0–15.0)
MCH: 33.6 pg (ref 26.0–34.0)
MCHC: 32.7 g/dL (ref 30.0–36.0)
MCV: 102.8 fL — ABNORMAL HIGH (ref 80.0–100.0)
Platelets: 171 K/uL (ref 150–400)
RBC: 3.96 MIL/uL (ref 3.87–5.11)
RDW: 12.6 % (ref 11.5–15.5)
WBC: 8.2 K/uL (ref 4.0–10.5)
nRBC: 0 % (ref 0.0–0.2)

## 2024-09-26 ENCOUNTER — Ambulatory Visit: Payer: Self-pay

## 2024-09-26 NOTE — Telephone Encounter (Signed)
 Per Eleanor Epps NP, recent results faxed to pt's PCP

## 2024-09-26 NOTE — Telephone Encounter (Signed)
-----   Message from Eleanor JONETTA Epps sent at 09/26/2024  8:40 AM EST ----- Please fax results to PCP ----- Message ----- From: Rebecka, Lab In [place] Sent: 09/23/2024  11:47 AM EST To: Eleanor JONETTA Epps, NP

## 2024-09-27 ENCOUNTER — Telehealth: Payer: Self-pay | Admitting: *Deleted

## 2024-09-27 ENCOUNTER — Other Ambulatory Visit: Payer: Self-pay | Admitting: Gynecologic Oncology

## 2024-09-27 NOTE — Telephone Encounter (Signed)
 Telephone call to check on pre-operative status.  Patient compliant with pre-operative instructions.  Reinforced nothing to eat after midnight. Clear liquids until 0845. Patient to arrive at 0945.  No questions or concerns voiced.  Instructed to call for any needs.

## 2024-09-28 ENCOUNTER — Ambulatory Visit (HOSPITAL_COMMUNITY): Admitting: Anesthesiology

## 2024-09-28 ENCOUNTER — Encounter (HOSPITAL_COMMUNITY): Payer: Self-pay | Admitting: Gynecologic Oncology

## 2024-09-28 ENCOUNTER — Ambulatory Visit (HOSPITAL_COMMUNITY)
Admission: RE | Admit: 2024-09-28 | Discharge: 2024-09-28 | Disposition: A | Discharge status: Home / Self Care | Source: Ambulatory Visit | Attending: Gynecologic Oncology | Admitting: Gynecologic Oncology

## 2024-09-28 ENCOUNTER — Encounter (HOSPITAL_COMMUNITY)
Admission: RE | Disposition: A | Discharge status: Home / Self Care | Payer: Self-pay | Source: Ambulatory Visit | Attending: Gynecologic Oncology

## 2024-09-28 DIAGNOSIS — Z780 Asymptomatic menopausal state: Secondary | ICD-10-CM | POA: Insufficient documentation

## 2024-09-28 DIAGNOSIS — N838 Other noninflammatory disorders of ovary, fallopian tube and broad ligament: Secondary | ICD-10-CM | POA: Diagnosis not present

## 2024-09-28 DIAGNOSIS — F17210 Nicotine dependence, cigarettes, uncomplicated: Secondary | ICD-10-CM | POA: Insufficient documentation

## 2024-09-28 DIAGNOSIS — E039 Hypothyroidism, unspecified: Secondary | ICD-10-CM

## 2024-09-28 DIAGNOSIS — I10 Essential (primary) hypertension: Secondary | ICD-10-CM | POA: Insufficient documentation

## 2024-09-28 DIAGNOSIS — Z79890 Hormone replacement therapy (postmenopausal): Secondary | ICD-10-CM | POA: Insufficient documentation

## 2024-09-28 DIAGNOSIS — N839 Noninflammatory disorder of ovary, fallopian tube and broad ligament, unspecified: Secondary | ICD-10-CM | POA: Diagnosis not present

## 2024-09-28 DIAGNOSIS — D270 Benign neoplasm of right ovary: Secondary | ICD-10-CM | POA: Diagnosis not present

## 2024-09-28 DIAGNOSIS — F17200 Nicotine dependence, unspecified, uncomplicated: Secondary | ICD-10-CM | POA: Diagnosis not present

## 2024-09-28 DIAGNOSIS — N9489 Other specified conditions associated with female genital organs and menstrual cycle: Secondary | ICD-10-CM | POA: Diagnosis not present

## 2024-09-28 DIAGNOSIS — I129 Hypertensive chronic kidney disease with stage 1 through stage 4 chronic kidney disease, or unspecified chronic kidney disease: Secondary | ICD-10-CM | POA: Diagnosis not present

## 2024-09-28 DIAGNOSIS — D271 Benign neoplasm of left ovary: Secondary | ICD-10-CM | POA: Diagnosis not present

## 2024-09-28 DIAGNOSIS — K219 Gastro-esophageal reflux disease without esophagitis: Secondary | ICD-10-CM | POA: Insufficient documentation

## 2024-09-28 DIAGNOSIS — N1831 Chronic kidney disease, stage 3a: Secondary | ICD-10-CM | POA: Diagnosis not present

## 2024-09-28 HISTORY — PX: ROBOTIC ASSISTED BILATERAL SALPINGO OOPHERECTOMY: SHX6078

## 2024-09-28 LAB — TYPE AND SCREEN
ABO/RH(D): A POS
Antibody Screen: NEGATIVE

## 2024-09-28 LAB — ABO/RH: ABO/RH(D): A POS

## 2024-09-28 SURGERY — SALPINGO-OOPHORECTOMY, BILATERAL, ROBOT-ASSISTED
Anesthesia: General | Site: Abdomen

## 2024-09-28 MED ORDER — DROPERIDOL 2.5 MG/ML IJ SOLN: 0.6250 mg | Freq: Once | INTRAMUSCULAR | Status: DC | PRN

## 2024-09-28 MED ORDER — BUPIVACAINE HCL 0.25 % IJ SOLN
INTRAMUSCULAR | Status: DC | PRN
  Administered 2024-09-28: 30 mL

## 2024-09-28 MED ORDER — ROCURONIUM BROMIDE 100 MG/10ML IV SOLN
INTRAVENOUS | Status: DC | PRN
  Administered 2024-09-28: 60 mg via INTRAVENOUS
  Administered 2024-09-28: 10 mg via INTRAVENOUS

## 2024-09-28 MED ORDER — PROPOFOL 10 MG/ML IV BOLUS
INTRAVENOUS | Status: DC | PRN
  Administered 2024-09-28: 140 mg via INTRAVENOUS
  Administered 2024-09-28: 10 mg via INTRAVENOUS

## 2024-09-28 MED ORDER — BUPIVACAINE HCL (PF) 0.25 % IJ SOLN
INTRAMUSCULAR | Status: AC
  Filled 2024-09-28: qty 30

## 2024-09-28 MED ORDER — BUPIVACAINE LIPOSOME 1.3 % IJ SUSP
INTRAMUSCULAR | Status: AC
  Filled 2024-09-28: qty 20

## 2024-09-28 MED ORDER — ORAL CARE MOUTH RINSE: 15.0000 mL | Freq: Once | OROMUCOSAL | Status: AC

## 2024-09-28 MED ORDER — SUGAMMADEX SODIUM 200 MG/2ML IV SOLN
INTRAVENOUS | Status: DC | PRN
  Administered 2024-09-28: 200 mg via INTRAVENOUS

## 2024-09-28 MED ORDER — ONDANSETRON HCL 4 MG/2ML IJ SOLN
INTRAMUSCULAR | Status: DC | PRN
  Administered 2024-09-28: 4 mg via INTRAVENOUS

## 2024-09-28 MED ORDER — DEXMEDETOMIDINE HCL IN NACL 80 MCG/20ML IV SOLN
INTRAVENOUS | Status: DC | PRN
  Administered 2024-09-28: 8 ug via INTRAVENOUS
  Administered 2024-09-28: 4 ug via INTRAVENOUS

## 2024-09-28 MED ORDER — BUPIVACAINE LIPOSOME 1.3 % IJ SUSP
INTRAMUSCULAR | Status: DC | PRN
  Administered 2024-09-28: 20 mL

## 2024-09-28 MED ORDER — LIDOCAINE HCL (CARDIAC) PF 100 MG/5ML IV SOSY
PREFILLED_SYRINGE | INTRAVENOUS | Status: DC | PRN
  Administered 2024-09-28: 50 mg via INTRAVENOUS

## 2024-09-28 MED ORDER — STERILE WATER FOR IRRIGATION IR SOLN
Status: DC | PRN
  Administered 2024-09-28: 1000 mL

## 2024-09-28 MED ORDER — CHLORHEXIDINE GLUCONATE 0.12 % MT SOLN
15.0000 mL | Freq: Once | OROMUCOSAL | Status: AC
  Administered 2024-09-28: 15 mL via OROMUCOSAL

## 2024-09-28 MED ORDER — LIDOCAINE HCL (PF) 2 % IJ SOLN
INTRAMUSCULAR | Status: AC
  Filled 2024-09-28: qty 5

## 2024-09-28 MED ORDER — HEPARIN SODIUM (PORCINE) 5000 UNIT/ML IJ SOLN
5000.0000 [IU] | INTRAMUSCULAR | Status: AC
  Administered 2024-09-28: 5000 [IU] via SUBCUTANEOUS
  Filled 2024-09-28: qty 1

## 2024-09-28 MED ORDER — FENTANYL CITRATE (PF) 100 MCG/2ML IJ SOLN
INTRAMUSCULAR | Status: AC
  Filled 2024-09-28: qty 2

## 2024-09-28 MED ORDER — PHENYLEPHRINE HCL-NACL 20-0.9 MG/250ML-% IV SOLN
INTRAVENOUS | Status: AC
  Filled 2024-09-28: qty 250

## 2024-09-28 MED ORDER — HYDROMORPHONE HCL 1 MG/ML IJ SOLN
INTRAMUSCULAR | Status: AC
  Filled 2024-09-28: qty 1

## 2024-09-28 MED ORDER — HYDROMORPHONE HCL 1 MG/ML IJ SOLN
0.2500 mg | INTRAMUSCULAR | Status: DC | PRN
  Administered 2024-09-28 (×4): 0.25 mg via INTRAVENOUS

## 2024-09-28 MED ORDER — DEXAMETHASONE SOD PHOSPHATE PF 10 MG/ML IJ SOLN
4.0000 mg | INTRAMUSCULAR | Status: AC
  Administered 2024-09-28: 4 mg via INTRAVENOUS

## 2024-09-28 MED ORDER — FENTANYL CITRATE (PF) 100 MCG/2ML IJ SOLN
INTRAMUSCULAR | Status: DC | PRN
  Administered 2024-09-28 (×2): 25 ug via INTRAVENOUS
  Administered 2024-09-28 (×5): 50 ug via INTRAVENOUS

## 2024-09-28 MED ORDER — PHENYLEPHRINE HCL-NACL 20-0.9 MG/250ML-% IV SOLN
INTRAVENOUS | Status: DC | PRN
  Administered 2024-09-28: 80 ug/min via INTRAVENOUS

## 2024-09-28 MED ORDER — ROCURONIUM BROMIDE 10 MG/ML (PF) SYRINGE
PREFILLED_SYRINGE | INTRAVENOUS | Status: AC
  Filled 2024-09-28: qty 10

## 2024-09-28 MED ORDER — PROPOFOL 10 MG/ML IV BOLUS
INTRAVENOUS | Status: AC
  Filled 2024-09-28: qty 20

## 2024-09-28 MED ORDER — ONDANSETRON HCL 4 MG/2ML IJ SOLN
INTRAMUSCULAR | Status: AC
  Filled 2024-09-28: qty 2

## 2024-09-28 MED ORDER — ALBUTEROL SULFATE HFA 108 (90 BASE) MCG/ACT IN AERS
INHALATION_SPRAY | RESPIRATORY_TRACT | Status: DC | PRN
  Administered 2024-09-28: 5 via RESPIRATORY_TRACT

## 2024-09-28 MED ORDER — LACTATED RINGERS IR SOLN
Status: DC | PRN
  Administered 2024-09-28: 1000 mL

## 2024-09-28 MED ORDER — SUGAMMADEX SODIUM 200 MG/2ML IV SOLN
INTRAVENOUS | Status: AC
  Filled 2024-09-28: qty 2

## 2024-09-28 MED ORDER — ACETAMINOPHEN 500 MG PO TABS
1000.0000 mg | ORAL_TABLET | ORAL | Status: AC
  Administered 2024-09-28: 1000 mg via ORAL
  Filled 2024-09-28: qty 2

## 2024-09-28 MED ORDER — LACTATED RINGERS IV SOLN: INTRAVENOUS | Status: DC

## 2024-09-28 MED ORDER — ALBUTEROL SULFATE HFA 108 (90 BASE) MCG/ACT IN AERS
INHALATION_SPRAY | RESPIRATORY_TRACT | Status: AC
  Filled 2024-09-28: qty 6.7

## 2024-09-28 SURGICAL SUPPLY — 68 items
APPLICATOR SURGIFLO ENDO (HEMOSTASIS) IMPLANT
BAG COUNTER SPONGE SURGICOUNT (BAG) IMPLANT
BAG LAPAROSCOPIC 12 15 PORT 16 (BASKET) IMPLANT
BLADE SURG SZ10 CARB STEEL (BLADE) IMPLANT
COVER BACK TABLE 60X90IN (DRAPES) ×2 IMPLANT
COVER TIP SHEARS 8 DVNC (MISCELLANEOUS) ×2 IMPLANT
DERMABOND ADVANCED .7 DNX12 (GAUZE/BANDAGES/DRESSINGS) ×2 IMPLANT
DRAPE ARM DVNC X/XI (DISPOSABLE) ×8 IMPLANT
DRAPE COLUMN DVNC XI (DISPOSABLE) ×2 IMPLANT
DRAPE SHEET LG 3/4 BI-LAMINATE (DRAPES) ×2 IMPLANT
DRAPE SURG IRRIG POUCH 19X23 (DRAPES) ×2 IMPLANT
DRIVER NDL MEGA SUTCUT DVNCXI (INSTRUMENTS) ×2 IMPLANT
DRSG OPSITE POSTOP 4X6 (GAUZE/BANDAGES/DRESSINGS) IMPLANT
DRSG OPSITE POSTOP 4X8 (GAUZE/BANDAGES/DRESSINGS) IMPLANT
ELECT PENCIL ROCKER SW 15FT (MISCELLANEOUS) IMPLANT
ELECT REM PT RETURN 15FT ADLT (MISCELLANEOUS) ×2 IMPLANT
FORCEPS BPLR FENES DVNC XI (FORCEP) ×2 IMPLANT
FORCEPS PROGRASP DVNC XI (FORCEP) ×2 IMPLANT
GAUZE 4X4 16PLY ~~LOC~~+RFID DBL (SPONGE) ×4 IMPLANT
GLOVE BIO SURGEON STRL SZ 6 (GLOVE) ×8 IMPLANT
GLOVE BIO SURGEON STRL SZ 6.5 (GLOVE) ×2 IMPLANT
GOWN STRL REUS W/ TWL LRG LVL3 (GOWN DISPOSABLE) ×8 IMPLANT
GRASPER SUT TROCAR 14GX15 (MISCELLANEOUS) IMPLANT
HOLDER FOLEY CATH W/STRAP (MISCELLANEOUS) IMPLANT
IRRIGATION SUCT STRKRFLW 2 WTP (MISCELLANEOUS) ×2 IMPLANT
KIT PROCEDURE DVNC SI (MISCELLANEOUS) IMPLANT
KIT TURNOVER KIT A (KITS) ×2 IMPLANT
LIGASURE IMPACT 36 18CM CVD LR (INSTRUMENTS) IMPLANT
MANIPULATOR ADVINCU DEL 3.0 PL (MISCELLANEOUS) IMPLANT
MANIPULATOR ADVINCU DEL 3.5 PL (MISCELLANEOUS) IMPLANT
MANIPULATOR UTERINE 4.5 ZUMI (MISCELLANEOUS) IMPLANT
NDL HYPO 21X1.5 SAFETY (NEEDLE) ×2 IMPLANT
NDL SPNL 18GX3.5 QUINCKE PK (NEEDLE) IMPLANT
OBTURATOR OPTICALSTD 8 DVNC (TROCAR) ×2 IMPLANT
PACK ROBOT GYN CUSTOM WL (TRAY / TRAY PROCEDURE) ×2 IMPLANT
PAD POSITIONING PINK XL (MISCELLANEOUS) ×2 IMPLANT
PORT ACCESS TROCAR AIRSEAL 12 (TROCAR) IMPLANT
SCISSORS LAP 5X45 EPIX DISP (ENDOMECHANICALS) IMPLANT
SCISSORS MNPLR CVD DVNC XI (INSTRUMENTS) ×2 IMPLANT
SCRUB CHG 4% DYNA-HEX 4OZ (MISCELLANEOUS) IMPLANT
SEAL UNIV 5-12 XI (MISCELLANEOUS) ×8 IMPLANT
SET TRI-LUMEN FLTR TB AIRSEAL (TUBING) ×2 IMPLANT
SPIKE FLUID TRANSFER (MISCELLANEOUS) ×2 IMPLANT
SPONGE T-LAP 18X18 ~~LOC~~+RFID (SPONGE) IMPLANT
SURGIFLO W/THROMBIN 8M KIT (HEMOSTASIS) IMPLANT
SUT MNCRL AB 4-0 PS2 18 (SUTURE) IMPLANT
SUT PDS AB 1 TP1 96 (SUTURE) IMPLANT
SUT STRATA PDS 0 30 CT-2.5 (SUTURE) IMPLANT
SUT STRATAFIX PDS+0 CT1 9 (SUTURE) IMPLANT
SUT V-LOC 180 0-0 GS22 (SUTURE) IMPLANT
SUT VIC AB 0 CT1 27XBRD ANTBC (SUTURE) IMPLANT
SUT VIC AB 2-0 CT1 TAPERPNT 27 (SUTURE) IMPLANT
SUT VIC AB 2-0 SH 27X BRD (SUTURE) IMPLANT
SUT VIC AB 4-0 PS2 18 (SUTURE) ×4 IMPLANT
SUT VICRYL 0 27 CT2 27 ABS (SUTURE) ×2 IMPLANT
SUT VLOC 180 0 9IN GS21 (SUTURE) IMPLANT
SUTURE STRATFX 0 PDS+ CT-2 23 (SUTURE) IMPLANT
SYR 10ML LL (SYRINGE) IMPLANT
SYSTEM BAG RETRIEVAL 10MM (BASKET) IMPLANT
SYSTEM RETRIEVL 5MM INZII UNIV (BASKET) IMPLANT
SYSTEM WOUND ALEXIS 18CM MED (MISCELLANEOUS) IMPLANT
TRAP SPECIMEN MUCUS 40CC (MISCELLANEOUS) IMPLANT
TRAY FOLEY MTR SLVR 16FR STAT (SET/KITS/TRAYS/PACK) ×2 IMPLANT
TROCAR PORT AIRSEAL 5X120 (TROCAR) IMPLANT
TROCAR XCEL NON-BLD 5MMX100MML (ENDOMECHANICALS) IMPLANT
UNDERPAD 30X36 HEAVY ABSORB (UNDERPADS AND DIAPERS) ×4 IMPLANT
WATER STERILE IRR 1000ML POUR (IV SOLUTION) ×2 IMPLANT
YANKAUER SUCT BULB TIP 10FT TU (MISCELLANEOUS) IMPLANT

## 2024-09-28 NOTE — Op Note (Signed)
 OPERATIVE NOTE  Pre-operative Diagnosis: Bilateral solid adnexal mass  Post-operative Diagnosis: same  Operation: Robotic-assisted laparoscopic bilateral salpingo-oophorectomy, mini-laparotomy for specimen delivery   Surgeon: Viktoria Crank MD  Assistant Surgeon: Olam Leonce Ada MD (an MD assistant was necessary for tissue manipulation, management of robotic instrumentation, retraction and positioning due to the complexity of the case and hospital policies).   Anesthesia: GET  Urine Output: 50 cc  Operative Findings: On EUA, small mobile uterus. Bilateral smooth and enlarged masses within the cul de sac. On intra-abdominal entry, normal upper abdominal survey. Normal omentum, small and large bowel, appendix. Small amount of ascites in the deep pelvis. Uterus 6-8 cm and normal in appearance. Right ovary mildly enlarged with solid appearing white mass, 3-4 cm. Left ovary enlarged with solid appearing white mass, 6 cm. Left ovary adherent to the pelvic side wall. Normal appearing tubes bilaterally. On frozen section, bilateral masses consistent with benign fibromas.  Estimated Blood Loss:  25 cc      Total IV Fluids: see I&O flowsheet         Specimens: bilateral tubes and ovaries, pelvic washings         Complications:  None apparent; patient tolerated the procedure well.         Disposition: PACU - hemodynamically stable.  Procedure Details  The patient was seen in the Holding Room. The risks, benefits, complications, treatment options, and expected outcomes were discussed with the patient.  The patient concurred with the proposed plan, giving informed consent.  The site of surgery properly noted/marked. The patient was identified as Kaitlin Sharp and the procedure verified as a Robotic-assisted bilateral salpingo-oophorectomy with any other indicated procedures.   After induction of anesthesia, the patient was draped and prepped in the usual sterile manner. Patient was placed in  supine position after anesthesia and draped and prepped in the usual sterile manner as follows: Her arms were tucked to her side with all appropriate precautions.  The patient was placed in the semi-lithotomy position in [place] stirrups.  The patient was secured to the bed with padding and tape across her chest.  The perineum and vagina were prepped with CHG. The patient's abdomen was prepped with ChloraPrep and then she was draped after the prep had been allowed to dry for 3 minutes.  A Time Out was held and the above information confirmed.  The urethra was prepped with Betadine. Foley catheter was placed.  A sterile speculum was placed in the vagina.  A sponge stick was placed in the vagina.  OG tube placement was confirmed and to suction.   Next, a 5 mm skin incision was made 1 cm below the subcostal margin in the midclavicular line.  The 5 mm Optiview port and scope was used for direct entry.  Opening pressure was under 10 mm CO2.  The abdomen was insufflated and the findings were noted as above.   At this point and all points during the procedure, the patient's intra-abdominal pressure did not exceed 15 mmHg. Next, an 12 mm skin incision was made superior to the umbilicus and an 8 mm right and left port were placed about 8 cm lateral to the robot port on the right and left side.  The 5 mm assist trocar was exchanged for a 5 mm airseal port. All ports were placed under direct visualization.  The patient was placed in steep Trendelenburg.  Bowel was folded away into the upper abdomen.  The robot was docked in the normal manner.  The right and left peritoneum were opened parallel to the IP ligament to open the retroperitoneal spaces bilaterally. The round ligaments were preserved. The ureter was noted to be on the medial leaf of the broad ligament.  The peritoneum above the ureter was incised and stretched and the infundibulopelvic ligament was skeletonized, cauterized and cut.  The utero-ovarian ligament and  fallopian tube were skeletonized, cauterized and transected just lateral to the uterine fundus, freeing the adnexa.  On the left, the ovary was adherent to the left pelvic sidewall requiring combination of sharp dissection, blunt dissection, and short burst of monopolar electrocautery.  The ovary was quite adherent over the peritoneum just adjacent to the ureter along the pelvic sidewall.  Care was taken to dissect the peritoneum free from the underlying ureter before applying short monopolar electrocautery to achieve hemostasis.  Both tubes and ovaries were detached, pelvis was irrigated with good hemostasis noted.  Intra-abdominal pressure was decreased to 5 mmHg.  Monopolar scissors were then removed and the camera was moved over to the right mid abdominal port.  Both adnexa were placed in a 15 mm Endo Catch bag inserted through the supraumbilical port.  All robotic instruments were removed.  The supraumbilical trocar was removed and the incision extended 8 cm with a scalpel. The incision was carried down to and through the fascia, with the abdomen insufflated, using monopolar electrocautery. The peritoneal incision was extended under direct visualization. The endocatch bag with the adnexa was delivered through the incision.  It was then sent for frozen section.  The incision was then closed with running #1 looped PDS tied in the midline. The subcutaneous tissue was irrigated and hemostasis achieved. Exparel was injected for local anesthesia. The subcutaneous tissue was closed with 2-0 Vicyrl in running fashion.   Once the frozen section had returned, the subcuticular tissue of all incisions was closed with 4-0 Vicryl and the skin was closed with 4-0 Monocryl in a subcuticular manner.  Dermabond was applied.    The vagina was swabbed with no bleeding noted. Foley catheter was removed.  All sponge, lap and needle counts were correct x  3.   The patient was transferred to the recovery room in stable  condition.  Comer Dollar, MD

## 2024-09-28 NOTE — Discharge Instructions (Addendum)
 AFTER SURGERY INSTRUCTIONS   Return to work: 4-6 weeks if applicable   Activity: 1. Be up and out of the bed during the day.  Take a nap if needed.  You may walk up steps but be careful and use the hand rail.  Stair climbing will tire you more than you think, you may need to stop part way and rest.    2. No lifting or straining for 6 weeks over 10 pounds. No pushing, pulling, straining for 6 weeks.   3. No driving for 4-89 days when the following criteria have been met: Do not drive if you are taking narcotic pain medicine and make sure that your reaction time has returned.    4. You can shower as soon as the next day after surgery. Shower daily.  Use your regular soap and water (not directly on the incision) and pat your incision(s) dry afterwards; don't rub.  No tub baths or submerging your body in water until cleared by your surgeon. If you have the soap that was given to you by pre-surgical testing that was used before surgery, you do not need to use it afterwards because this can irritate your incisions.    5. No sexual activity and nothing in the vagina for 6 weeks, 12 weeks if you have a hysterectomy (removal of the uterus and cervix).   6. You may experience a small amount of clear drainage from your incisions, which is normal.  If the drainage persists, increases, or changes color please call the office.   7. Do not use creams, lotions, or ointments such as neosporin on your incisions after surgery until advised by your surgeon because they can cause removal of the dermabond glue on your incisions.     8. You may experience vaginal spotting after surgery or when the stitches at the top of the vagina begin to dissolve.  The spotting is normal but if you experience heavy bleeding, call our office.   9. Take Tylenol first for pain if you are able to take these medication and only use narcotic pain medication for severe pain not relieved by the Tylenol.  Monitor your Tylenol intake to a max  of 4,000 mg in a 24 hour period.   Diet: 1. Low sodium Heart Healthy Diet is recommended but you are cleared to resume your normal (before surgery) diet after your procedure.   2. It is safe to use a laxative, such as Miralax or Colace, if you have difficulty moving your bowels before surgery. You have been prescribed Sennakot-S to take at bedtime every evening after surgery to keep bowel movements regular and to prevent constipation.     Wound Care: 1. Keep clean and dry.  Shower daily.   Reasons to call the Doctor: Fever - Oral temperature greater than 100.4 degrees Fahrenheit Foul-smelling vaginal discharge Difficulty urinating Nausea and vomiting Increased pain at the site of the incision that is unrelieved with pain medicine. Difficulty breathing with or without chest pain New calf pain especially if only on one side Sudden, continuing increased vaginal bleeding with or without clots.   Contacts: For questions or concerns you should contact:   Dr. Comer Dollar at [phone]   Eleanor Epps, NP at [phone]   After Hours: call [phone] and have the GYN Oncologist paged/contacted (after 5 pm or on the weekends). You will speak with an after hours RN and let he or she know you have had surgery.   Messages sent via mychart are  for non-urgent matters and are not responded to after hours so for urgent needs, please call the after hours number.

## 2024-09-28 NOTE — Interval H&P Note (Signed)
 History and Physical Interval Note:  09/28/2024 11:08 AM  Kaitlin Sharp  has presented today for surgery, with the diagnosis of N94.89 ADNEXAL MASS.  The various methods of treatment have been discussed with the patient and family. After consideration of risks, benefits and other options for treatment, the patient has consented to  Procedure(s) with comments: SALPINGO-OOPHORECTOMY, BILATERAL, ROBOT-ASSISTED (Bilateral) HYSTERECTOMY, TOTAL, ROBOT-ASSISTED (N/A) - POSSIBLE STAGING, POSSIBLE LAPAROTOMY as a surgical intervention.  The patient's history has been reviewed, patient examined, no change in status, stable for surgery.  I have reviewed the patient's chart and labs.  Questions were answered to the patient's satisfaction.     Comer JONELLE Dollar

## 2024-09-28 NOTE — Transfer of Care (Signed)
 Immediate Anesthesia Transfer of Care Note  Patient: Kaitlin Sharp  Procedure(s) Performed: SALPINGO-OOPHORECTOMY, BILATERAL, ROBOT-ASSISTED, WITH MINI LAPAROTOMY (Bilateral: Abdomen)  Patient Location: PACU  Anesthesia Type:General  Level of Consciousness: awake, alert , oriented, and patient cooperative  Airway & Oxygen Therapy: Patient Spontanous Breathing and Patient connected to face mask oxygen  Post-op Assessment: Report given to RN and Post -op Vital signs reviewed and stable  Post vital signs: Reviewed and stable  Last Vitals:  Vitals Value Taken Time  BP 134/85 09/28/24 14:09  Temp 36.5 C 09/28/24 14:09  Pulse 46 09/28/24 14:14  Resp 11 09/28/24 14:14  SpO2 100 % 09/28/24 14:14  Vitals shown include unfiled device data.  Last Pain:  Vitals:   09/28/24 1409  TempSrc:   PainSc: Asleep         Complications: No notable events documented.

## 2024-09-28 NOTE — Brief Op Note (Signed)
 09/28/2024  2:47 PM  PATIENT:  Rock Reitzel Diep  78 y.o. female  PRE-OPERATIVE DIAGNOSIS:  N94.89 ADNEXAL MASS  POST-OPERATIVE DIAGNOSIS:  N94.89 ADNEXAL MASS  PROCEDURE:  Procedure(s): SALPINGO-OOPHORECTOMY, BILATERAL, ROBOT-ASSISTED, WITH MINI LAPAROTOMY (Bilateral)  SURGEON:  Surgeons and Role:    DEWAINE Dollar, Comer SAUNDERS, MD - Primary    * Rogelio Planas, MD - Assisting  ANESTHESIA:   general  EBL:  40 mL   BLOOD ADMINISTERED:none  DRAINS: none   LOCAL MEDICATIONS USED:  exparel, marcaine  SPECIMEN:  pelvic washings, bilateral tubes and ovaries  DISPOSITION OF SPECIMEN:  PATHOLOGY  COUNTS:  YES  TOURNIQUET:  * No tourniquets in log *  DICTATION: .Note written in EPIC  PLAN OF CARE: Discharge to home after PACU  PATIENT DISPOSITION:  PACU - hemodynamically stable.   Delay start of Pharmacological VTE agent (>24hrs) due to surgical blood loss or risk of bleeding: not applicable

## 2024-09-28 NOTE — Anesthesia Postprocedure Evaluation (Signed)
 Anesthesia Post Note  Patient: Kaitlin Sharp  Procedure(s) Performed: SALPINGO-OOPHORECTOMY, BILATERAL, ROBOT-ASSISTED, WITH MINI LAPAROTOMY (Bilateral: Abdomen)     Patient location during evaluation: PACU Anesthesia Type: General Level of consciousness: sedated and patient cooperative Pain management: pain level controlled Vital Signs Assessment: post-procedure vital signs reviewed and stable Respiratory status: spontaneous breathing Cardiovascular status: stable Anesthetic complications: no   No notable events documented.  Last Vitals:  Vitals:   09/28/24 1700 09/28/24 1715  BP: (!) 110/53 103/62  Pulse: (!) 45 (!) 40  Resp: 14 14  Temp: 36.6 C 36.6 C  SpO2: 98% 97%    Last Pain:  Vitals:   09/28/24 1715  TempSrc: Oral  PainSc: 2                  Norleen Pope

## 2024-09-28 NOTE — Anesthesia Preprocedure Evaluation (Addendum)
 Anesthesia Evaluation  Patient identified by MRN, date of birth, ID band Patient awake    Reviewed: Allergy & Precautions, NPO status , Patient's Chart, lab work & pertinent test results  Airway Mallampati: II  TM Distance: >3 FB Neck ROM: Full    Dental  (+) Dental Advisory Given, Teeth Intact   Pulmonary Current Smoker and Patient abstained from smoking.   + rhonchi  (-) wheezing      Cardiovascular hypertension, + Peripheral Vascular Disease  Normal cardiovascular exam+ dysrhythmias  Rhythm:Regular Rate:Normal     Neuro/Psych negative neurological ROS     GI/Hepatic Neg liver ROS,GERD  ,,  Endo/Other  Hypothyroidism    Renal/GU Renal disease     Musculoskeletal  (+) Arthritis ,    Abdominal   Peds  Hematology  (+) Blood dyscrasia, anemia   Anesthesia Other Findings   Reproductive/Obstetrics                              Anesthesia Physical Anesthesia Plan  ASA: 3  Anesthesia Plan: General   Post-op Pain Management: Tylenol PO (pre-op)*   Induction:   PONV Risk Score and Plan: 4 or greater and Ondansetron, Dexamethasone and Treatment may vary due to age or medical condition  Airway Management Planned: Oral ETT  Additional Equipment:   Intra-op Plan:   Post-operative Plan: Extubation in OR  Informed Consent: I have reviewed the patients History and Physical, chart, labs and discussed the procedure including the risks, benefits and alternatives for the proposed anesthesia with the patient or authorized representative who has indicated his/her understanding and acceptance.     Dental advisory given  Plan Discussed with: CRNA  Anesthesia Plan Comments: (2 x PIV)         Anesthesia Quick Evaluation

## 2024-09-28 NOTE — Anesthesia Procedure Notes (Signed)
 Procedure Name: Intubation Date/Time: 09/28/2024 12:15 PM  Performed by: Kathern Rollene LABOR, CRNAPre-anesthesia Checklist: Patient identified, Emergency Drugs available, Suction available and Patient being monitored Patient Re-evaluated:Patient Re-evaluated prior to induction Oxygen Delivery Method: Circle system utilized Preoxygenation: Pre-oxygenation with 100% oxygen Induction Type: IV induction Ventilation: Mask ventilation without difficulty Laryngoscope Size: Mac and 3 Grade View: Grade I Tube type: Oral Tube size: 7.0 mm Number of attempts: 1 Airway Equipment and Method: Stylet Placement Confirmation: ETT inserted through vocal cords under direct vision, positive ETCO2 and breath sounds checked- equal and bilateral Secured at: 22 cm Tube secured with: Tape Dental Injury: Teeth and Oropharynx as per pre-operative assessment

## 2024-09-29 ENCOUNTER — Other Ambulatory Visit: Payer: Self-pay | Admitting: Gynecologic Oncology

## 2024-09-29 ENCOUNTER — Other Ambulatory Visit (HOSPITAL_COMMUNITY)
Admission: RE | Admit: 2024-09-29 | Discharge: 2024-09-29 | Disposition: A | Source: Ambulatory Visit | Attending: Gynecologic Oncology | Admitting: Gynecologic Oncology

## 2024-09-29 ENCOUNTER — Other Ambulatory Visit: Payer: Self-pay | Admitting: Family Medicine

## 2024-09-29 ENCOUNTER — Encounter (HOSPITAL_COMMUNITY): Payer: Self-pay | Admitting: Gynecologic Oncology

## 2024-09-29 ENCOUNTER — Telehealth: Payer: Self-pay | Admitting: *Deleted

## 2024-09-29 DIAGNOSIS — N9489 Other specified conditions associated with female genital organs and menstrual cycle: Secondary | ICD-10-CM | POA: Diagnosis not present

## 2024-09-29 DIAGNOSIS — D271 Benign neoplasm of left ovary: Secondary | ICD-10-CM | POA: Diagnosis not present

## 2024-09-29 DIAGNOSIS — E039 Hypothyroidism, unspecified: Secondary | ICD-10-CM

## 2024-09-29 DIAGNOSIS — D270 Benign neoplasm of right ovary: Secondary | ICD-10-CM | POA: Diagnosis not present

## 2024-09-29 DIAGNOSIS — N838 Other noninflammatory disorders of ovary, fallopian tube and broad ligament: Secondary | ICD-10-CM | POA: Diagnosis not present

## 2024-09-29 NOTE — Telephone Encounter (Signed)
 Spoke with Kaitlin Sharp this morning. She states she is eating, drinking and urinating well. She has not had a BM yet but is passing gas. She is taking senokot as prescribed and encouraged her to drink plenty of water. She denies fever or chills. Incisions are dry and intact. She rates her pain 6/10. Her pain is controlled with tylenol.     Instructed to call office with any fever, chills, purulent drainage, uncontrolled pain or any other questions or concerns. Patient verbalizes understanding.   Pt aware of post op appointments as well as the office number [phone] and after hours number [phone] to call if she has any questions or concerns

## 2024-09-29 NOTE — Addendum Note (Signed)
 Addended by: Porshea Janowski R on: 09/29/2024 04:39 PM   Modules accepted: Orders

## 2024-09-30 LAB — AEROBIC/ANAEROBIC CULTURE W GRAM STAIN (SURGICAL/DEEP WOUND)

## 2024-10-03 ENCOUNTER — Ambulatory Visit: Payer: Self-pay | Admitting: Gynecologic Oncology

## 2024-10-03 LAB — CYTOLOGY - NON PAP

## 2024-10-03 LAB — SURGICAL PATHOLOGY

## 2024-10-06 ENCOUNTER — Inpatient Hospital Stay: Attending: Gynecologic Oncology | Admitting: Gynecologic Oncology

## 2024-10-06 ENCOUNTER — Telehealth: Payer: Self-pay | Admitting: *Deleted

## 2024-10-06 NOTE — Telephone Encounter (Signed)
 Per Dr Viktoria canceled appt for today, patient aware

## 2024-10-10 ENCOUNTER — Encounter: Payer: Self-pay | Admitting: Family Medicine

## 2024-10-10 ENCOUNTER — Ambulatory Visit: Admitting: Family Medicine

## 2024-10-10 VITALS — BP 136/75 | HR 55 | Ht 61.0 in | Wt 138.4 lb

## 2024-10-10 DIAGNOSIS — M545 Low back pain, unspecified: Secondary | ICD-10-CM | POA: Diagnosis not present

## 2024-10-10 DIAGNOSIS — G8929 Other chronic pain: Secondary | ICD-10-CM | POA: Diagnosis not present

## 2024-10-10 DIAGNOSIS — M064 Inflammatory polyarthropathy: Secondary | ICD-10-CM | POA: Diagnosis not present

## 2024-10-10 DIAGNOSIS — Z634 Disappearance and death of family member: Secondary | ICD-10-CM | POA: Diagnosis not present

## 2024-10-10 MED ORDER — OXYCODONE HCL 5 MG PO TABS: 5.0000 mg | ORAL_TABLET | ORAL | 0 refills | Status: AC | PRN

## 2024-10-10 MED ORDER — MELOXICAM 7.5 MG PO TABS: 7.5000 mg | ORAL_TABLET | ORAL | 3 refills | Status: AC

## 2024-10-10 NOTE — Patient Instructions (Signed)
 It was nice to see you today,  We addressed the following topics today: Please do the following for your back pain:   Please continue taking Tylenol  every 8 hours for your back pain. 2. You can try using topical pain relievers like Voltaren  gel, lidocaine  patches, or Icy Hot on the painful area. You can also try capsaicin cream, but be aware it may cause a burning sensation initially.. You can apply heat to your back to help with the pain. 3. If tylenol  and topicals are not enough You can start taking the meloxicam  7.5 mg every other day. You can take this with your Tylenol . 4. Lastly if nothing else is helping You can use the oxycodone  for severe pain, but try to use it only when needed. - I have sent a prescription for the meloxicam  and oxycodone  to your pharmacy. - Please consider a referral to physical therapy for your back pain. You can let me know if you change your mind, and I will send the referral. - Please follow up in 4 months for a check-up. Your next physical exam is scheduled for July of next year.  Have a great day,  Rolan Slain, MD

## 2024-10-10 NOTE — Assessment & Plan Note (Signed)
 The patient is experiencing significant grief following multiple recent losses. She denies current interest in counseling but was provided with resources for bereavement support, including therapy and group sessions, and advised to contact her insurance for a list of providers if she decides to seek help in the future.

## 2024-10-10 NOTE — Progress Notes (Unsigned)
 Established Patient Office Visit  Subjective   Patient ID: Kaitlin Sharp, female    DOB: 1945/12/11  Age: 78 y.o. MRN: [id number]  Chief Complaint  Patient presents with   Medical Management of Chronic Issues    HPI  Subjective - Reports ongoing left-sided back pain, described as a dull, aching pain, similar to a toothache. The pain is located in the middle to left lower back, near the ribs. It is not sharp unless lifting something heavy. The pain is worse with prolonged sitting and sometimes when lying down, but can also be relieved by lying down. Walking helps somewhat. Leaning forward also provides relief. The pain does not radiate down the leg. Reports needing to lean on a shopping cart when walking for extended periods, such as grocery shopping. The pain is located in the area of the left kidney, but imaging has ruled out a renal cause. A previous trial of meloxicam  provided some relief, but it was stopped for surgery and has not been restarted. Currently managing pain with scheduled Tylenol . Used oxycodone  post-operatively for surgical pain as no other pain medication was prescribed. Has one oxycodone  pill remaining.  Medications: Allopurinol , Synthroid , oxycodone  as needed, pravastatin , spironolactone , Senna as needed. Has used a generic version of Voltaren  gel and has some brand-name Voltaren  gel available. Has used [facility] in the past.  PMH: Scoliosis. Gout. Hypothyroidism. Hypercholesterolemia. Recent robotic-assisted surgery for benign masses, with follow-up scheduled for 10/28/2024. History of physical therapy for back pain, which was helpful but symptoms returned after discontinuing exercises.  PSH: Robotic-assisted surgery for uterine mass  FH: Brother had back problems.  Social Hx: Lives alone. Daughter lives nearby. Reports significant recent life stressors, including the deaths of two brothers, a nephew, and a companion of 40 years in the past year. Denies current  need for counseling but was offered resources for bereavement support.  ROS: - Constitutional: Denies fever, chills. - Musculoskeletal: Reports left-sided back pain as described above. Denies pain radiating down the leg. - All other systems reviewed and are negative.    The 10-year ASCVD risk score (Arnett DK, et al., 2019) is: 37.3%  Health Maintenance Due  Topic Date Due   DTaP/Tdap/Td (1 - Tdap) Never done   COVID-19 Vaccine (3 - 2025-26 season) 06/27/2024      Objective:     BP 136/75   Pulse (!) 55   Ht 5' 1 (1.549 m)   Wt 138 lb 6.4 oz (62.8 kg)   SpO2 98%   BMI 26.15 kg/m  {Vitals History (Optional):23777}  Physical Exam Gen: alert, oriented Pulm: no respiratory distress MSK: Examination of the back reveals tenderness to palpation over the left lower rib area. Pressure on the area provides some relief. No tenderness over the pelvis or SI joint. No evidence of compression fractures on prior imaging. Psych: pleasant affect   No results found for any visits on 10/10/24.      Assessment & Plan:   Bereavement Assessment & Plan: The patient is experiencing significant grief following multiple recent losses. She denies current interest in counseling but was provided with resources for bereavement support, including therapy and group sessions, and advised to contact her insurance for a list of providers if she decides to seek help in the future.   Inflammatory polyarthritis (HCC)  Chronic left-sided low back pain without sciatica Assessment & Plan: The patient presents with chronic left-sided back pain, described as a dull ache. The pain is located in the region of  the left lower ribs and is exacerbated by prolonged sitting and walking. Examination reveals tenderness over the left lower ribs, with relief from pressure. The pain is likely musculoskeletal in origin, possibly related to intercostal muscle strain, inflammation, or nerve compression secondary to  scoliosis. Prior imaging, including a CT scan in July, did not show an acute cause. A trial of meloxicam  provided some relief. - Continue Tylenol  every 8 hours as scheduled. - Recommended trial of topical agents for pain relief, including Voltaren  gel, lidocaine  patches, menthol-containing creams (e.g., Icy Hot), or capsaicin cream/patches. Advised on the different mechanisms of action and to try different options to find what is most effective. - Recommended heat application to the affected area. - Recommended a referral to physical therapy. The patient declined a referral at this time. - Prescribed meloxicam  7.5 mg, to be taken every other day. Advised that this can be taken with Tylenol . - Prescribed oxycodone , to be taken as needed for severe pain. The prescription will be for whole tablets. - Advised to follow up in 4 months for re-evaluation. A physical exam is scheduled for July of next year.   Other orders -     oxyCODONE  HCl; Take 1 tablet (5 mg total) by mouth every 4 (four) hours as needed for severe pain (pain score 7-10) or breakthrough pain.  Dispense: 10 tablet; Refill: 0 -     Meloxicam ; Take 1 tablet (7.5 mg total) by mouth every other day.  Dispense: 15 tablet; Refill: 3     Return in about 4 months (around 02/08/2025) for back pain.    Toribio MARLA Slain, MD

## 2024-10-10 NOTE — Assessment & Plan Note (Signed)
 The patient presents with chronic left-sided back pain, described as a dull ache. The pain is located in the region of the left lower ribs and is exacerbated by prolonged sitting and walking. Examination reveals tenderness over the left lower ribs, with relief from pressure. The pain is likely musculoskeletal in origin, possibly related to intercostal muscle strain, inflammation, or nerve compression secondary to scoliosis. Prior imaging, including a CT scan in July, did not show an acute cause. A trial of meloxicam  provided some relief. - Continue Tylenol  every 8 hours as scheduled. - Recommended trial of topical agents for pain relief, including Voltaren  gel, lidocaine  patches, menthol-containing creams (e.g., Icy Hot), or capsaicin cream/patches. Advised on the different mechanisms of action and to try different options to find what is most effective. - Recommended heat application to the affected area. - Recommended a referral to physical therapy. The patient declined a referral at this time. - Prescribed meloxicam  7.5 mg, to be taken every other day. Advised that this can be taken with Tylenol . - Prescribed oxycodone , to be taken as needed for severe pain. The prescription will be for whole tablets. - Advised to follow up in 4 months for re-evaluation. A physical exam is scheduled for July of next year.

## 2024-10-13 NOTE — Assessment & Plan Note (Signed)
 Continue allopurinol  for gout. Negative autoimmune workup

## 2024-10-24 NOTE — Patient Instructions (Addendum)
 You are healing well after surgery. Continue with the restrictions of no heavy lifting over 10 lbs, pushing, pulling, straining for the full six weeks after surgery.   Given benign pathology, no follow up needed with our office in the future unless needs arise. Plan to follow up with your regular providers.   Please call the office for any questions, concerns, new symptoms related to recent surgery at [phone].

## 2024-10-24 NOTE — Progress Notes (Unsigned)
 GYNECOLOGIC ONCOLOGY POST-OPERATIVE FOLLOW UP  Patient Name: Kaitlin Sharp  Patient Age: 78 y.o. Date of Service: 10/28/2024 Referring Provider: Winton Felt, MD Primary Care Provider: Chandra Toribio POUR, MD Consulting Provider: Comer Dollar, MD   Assessment/Plan:  Postmenopausal patient who initially presented to the office with bilateral solid-appearing adnexal masses. On 09/28/2024, she underwent robotic-assisted laparoscopic bilateral salpingo-oophorectomy, mini-laparotomy for specimen delivery with Dr. Comer Dollar. 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   She is doing well post-operatively and meeting milestones. Given benign pathology, no follow up needed with our office at this time. She is advised to follow up with your regular providers and to call our office for any needs, concerns, or new symptoms related to her recent surgery.  Eleanor Epps NP [facility]  ___________________________________________  Chief Complaint: No chief complaint on file.   History of Present Illness:  Kaitlin Sharp is a 78 y.o. female who was seen in consultation at the request of Dr. Winton Felt for an evaluation of bilateral adnexal masses.  CT renal stone study done for RLQ and flank pain on 7/21. This showed difficult to define pelvic organs with prominent right ovary for age at 2.9 cm. Diverticulosis without diverticulitis noted. Follow-up pelvic ultrasound on 05/24/24 showed slightly enlarge heterogenous appearance of right ovary, suspect possible solid right ovarian lesion. 4.9 solid left adnexal mass, abuts the left LUS, could represented pedunculated fibroid vs ovarian adnexal mass. MRI on 10/14 showed bilateral solid T2 hypointense adnexal masses, measuring 2.7 cm and 5.7 cm on right and left respectively. Differential includes ovarian fibrothecomas and pedunculated uterine fibroids. Normal appearance of the uterus.   Tumor markers  showed CA-125 of 29.9, HE4 of 182, and postmenopausal ROMA of 4.52.   On 09/28/2024, she underwent robotic-assisted laparoscopic bilateral salpingo-oophorectomy, mini-laparotomy for specimen delivery with Dr. Comer Dollar. 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    PAST MEDICAL HISTORY:  Past Medical History:  Diagnosis Date   Anemia    Arthritis    Dyslipidemia    Dysrhythmia    bradycardia   GERD (gastroesophageal reflux disease)    History of kidney stones    Hypertension    Hypothyroidism    Kidney calculi    Peripheral vascular disease    claudication   Pre-diabetes      PAST SURGICAL HISTORY:  Past Surgical History:  Procedure Laterality Date   DILATION AND CURETTAGE OF UTERUS     LITHOTRIPSY     ROBOTIC ASSISTED BILATERAL SALPINGO OOPHERECTOMY Bilateral 09/28/2024   Procedure: SALPINGO-OOPHORECTOMY, BILATERAL, ROBOT-ASSISTED, WITH MINI LAPAROTOMY;  Surgeon: Dollar Comer SAUNDERS, MD;  Location: [facility];  Service: Gynecology;  Laterality: Bilateral;   TONSILLECTOMY     TUBAL LIGATION      OB/GYN HISTORY:  OB History  Gravida Para Term Preterm AB Living  2    1 1   SAB IAB Ectopic Multiple Live Births      1    # Outcome Date GA Lbr Len/2nd Weight Sex Type Anes PTL Lv  2 Gravida 12/19/62    F Vag-Spont   LIV  1 AB             No LMP recorded. Patient is postmenopausal.  Age at menarche: 79  Age at menopause: 89s Hx of HRT: yes Hx of STDs: denies Last pap: unsure History of abnormal pap smears: denies  SCREENING STUDIES:  Last mammogram: 2016  Last colonoscopy: 2022  MEDICATIONS: Outpatient Encounter Medications as of 10/28/2024  Medication Sig   allopurinol  (ZYLOPRIM ) 100 MG tablet TAKE 1 TABLET BY MOUTH EVERY DAY   levothyroxine  (SYNTHROID ) 75 MCG tablet TAKE 1 TABLET BY MOUTH EVERY DAY   meloxicam  (MOBIC ) 7.5 MG tablet Take 1 tablet (7.5 mg total) by mouth every other day.   oxyCODONE  (OXY IR/ROXICODONE ) 5 MG immediate release  tablet Take 1 tablet (5 mg total) by mouth every 4 (four) hours as needed for severe pain (pain score 7-10) or breakthrough pain.   pravastatin  (PRAVACHOL ) 20 MG tablet TAKE 1 TABLET BY MOUTH EVERYDAY AT BEDTIME   senna-docusate (SENOKOT-S) 8.6-50 MG tablet Take 2 tablets by mouth at bedtime. For AFTER surgery, do not take if having diarrhea   spironolactone  (ALDACTONE ) 25 MG tablet Take 1 tablet (25 mg total) by mouth daily.   No facility-administered encounter medications on file as of 10/28/2024.    ALLERGIES:  No Known Allergies   FAMILY HISTORY:  Family History  Problem Relation Age of Onset   Brain cancer Father    Heart attack Brother    Stomach cancer Paternal Grandmother    Kidney cancer Paternal Uncle    Cancer Paternal Uncle      SOCIAL HISTORY:  Social Connections: Socially Isolated (08/06/2024)   Social Connection and Isolation Panel    Frequency of Communication with Friends and Family: Once a week    Frequency of Social Gatherings with Friends and Family: Never    Attends Religious Services: Never    Database Administrator or Organizations: No    Attends Engineer, Structural: Not on file    Marital Status: Divorced    REVIEW OF SYSTEMS:  See interval  Physical Exam:  Vital Signs for this encounter:  There were no vitals taken for this visit. There is no height or weight on file to calculate BMI. General: Alert, oriented, no acute distress.  HEENT: Normocephalic, atraumatic. Sclera anicteric.  Chest: Clear to auscultation bilaterally.  Some expiratory wheezing, no rhonchi, or rales. Cardiovascular: Regular rate and rhythm, no murmurs, rubs, or gallops.  Abdomen: Normoactive bowel sounds. Soft, nondistended, nontender to palpation. No masses or hepatosplenomegaly appreciated. No palpable fluid wave.  Extremities: Grossly normal range of motion. Warm, well perfused. No edema bilaterally.  Skin: No rashes or lesions.  Lymphatics: No cervical,  supraclavicular, or inguinal adenopathy.  GU:  Normal external female genitalia. No lesions. No discharge or bleeding.             Bladder/urethra:  No lesions or masses, well supported bladder             Vagina: Moderately atrophic, no lesions.             Cervix: Normal appearing, no lesions.             Uterus: Exam very poorly tolerated by the patient.  Uterus somewhat difficult to appreciate but there is some fullness in the cul-de-sac.              Adnexa: Again difficult to appreciate due to poor toleration of exam, see above.  Rectal: Deferred.  LABORATORY AND RADIOLOGIC DATA:  Outside medical records were reviewed to synthesize the above history, along with the history and physical obtained during the visit.   Final pathology: A. FALLOPIAN TUBE, OVARY, BILATERAL, SALPINGO OOPHORECTOMY:  - Bilateral ovarian fibromas.  - Bilateral fallopian tubes with fimbriated end.  - Negative for malignancy.  No malignant cells on pelvic washings.  Lab Results  Component Value Date  WBC 8.2 09/23/2024   HGB 13.3 09/23/2024   HCT 40.7 09/23/2024   PLT 171 09/23/2024   GLUCOSE 108 (H) 09/23/2024   CHOL 173 02/16/2024   TRIG 141 02/16/2024   HDL 51 02/16/2024   LDLCALC 97 02/16/2024   ALT 16 09/23/2024   AST 24 09/23/2024   NA 138 09/23/2024   K 4.9 09/23/2024   CL 102 09/23/2024   CREATININE 1.12 (H) 09/23/2024   BUN 15 09/23/2024   CO2 26 09/23/2024   TSH 2.570 08/11/2023   HGBA1C 5.9 (H) 02/16/2024

## 2024-10-28 ENCOUNTER — Inpatient Hospital Stay: Attending: Gynecologic Oncology | Admitting: Gynecologic Oncology

## 2024-10-28 VITALS — BP 134/70 | HR 60 | Temp 97.5°F | Resp 19 | Wt 131.9 lb

## 2024-10-28 DIAGNOSIS — Z9079 Acquired absence of other genital organ(s): Secondary | ICD-10-CM

## 2024-10-28 DIAGNOSIS — D270 Benign neoplasm of right ovary: Secondary | ICD-10-CM

## 2024-10-28 DIAGNOSIS — N9489 Other specified conditions associated with female genital organs and menstrual cycle: Secondary | ICD-10-CM

## 2024-10-28 DIAGNOSIS — D279 Benign neoplasm of unspecified ovary: Secondary | ICD-10-CM | POA: Insufficient documentation

## 2024-10-28 DIAGNOSIS — Z90722 Acquired absence of ovaries, bilateral: Secondary | ICD-10-CM

## 2024-10-28 DIAGNOSIS — D271 Benign neoplasm of left ovary: Secondary | ICD-10-CM

## 2025-03-23 ENCOUNTER — Ambulatory Visit
